# Patient Record
Sex: Male | Born: 1955 | Race: White | Hispanic: No | Smoking: Current every day smoker
Health system: Southern US, Community
[De-identification: ages and names within clinical notes are randomized; demographics above are authoritative.]

## PROBLEM LIST (undated history)

## (undated) DIAGNOSIS — M1990 Unspecified osteoarthritis, unspecified site: Secondary | ICD-10-CM

## (undated) DIAGNOSIS — F419 Anxiety disorder, unspecified: Secondary | ICD-10-CM

## (undated) DIAGNOSIS — J189 Pneumonia, unspecified organism: Secondary | ICD-10-CM

## (undated) DIAGNOSIS — F329 Major depressive disorder, single episode, unspecified: Secondary | ICD-10-CM

## (undated) DIAGNOSIS — F32A Depression, unspecified: Secondary | ICD-10-CM

## (undated) HISTORY — PX: KNEE SURGERY: SHX244

## (undated) HISTORY — PX: BACK SURGERY: SHX140

## (undated) HISTORY — PX: HERNIA REPAIR: SHX51

---

## 2001-10-03 ENCOUNTER — Emergency Department (HOSPITAL_COMMUNITY): Admission: EM | Admit: 2001-10-03 | Discharge: 2001-10-03 | Payer: Self-pay

## 2002-12-05 ENCOUNTER — Emergency Department (HOSPITAL_COMMUNITY): Admission: EM | Admit: 2002-12-05 | Discharge: 2002-12-05 | Payer: Self-pay | Admitting: Emergency Medicine

## 2002-12-05 ENCOUNTER — Encounter: Payer: Self-pay | Admitting: Emergency Medicine

## 2004-10-05 ENCOUNTER — Emergency Department (HOSPITAL_COMMUNITY): Admission: EM | Admit: 2004-10-05 | Discharge: 2004-10-06 | Payer: Self-pay | Admitting: Emergency Medicine

## 2006-12-28 ENCOUNTER — Ambulatory Visit: Payer: Self-pay | Admitting: Family Medicine

## 2006-12-28 ENCOUNTER — Encounter (INDEPENDENT_AMBULATORY_CARE_PROVIDER_SITE_OTHER): Payer: Self-pay | Admitting: Internal Medicine

## 2006-12-28 LAB — CONVERTED CEMR LAB: Herpes Simplex Vrs I&II-IgM Ab (EIA): NOT DETECTED

## 2008-09-29 ENCOUNTER — Encounter: Payer: Self-pay | Admitting: Internal Medicine

## 2009-04-20 ENCOUNTER — Encounter: Payer: Self-pay | Admitting: Internal Medicine

## 2009-04-20 ENCOUNTER — Encounter: Admission: RE | Admit: 2009-04-20 | Discharge: 2009-04-20 | Payer: Self-pay | Admitting: Family Medicine

## 2009-04-23 ENCOUNTER — Encounter: Payer: Self-pay | Admitting: Internal Medicine

## 2009-04-23 ENCOUNTER — Encounter: Admission: RE | Admit: 2009-04-23 | Discharge: 2009-04-23 | Payer: Self-pay | Admitting: Family Medicine

## 2009-04-24 ENCOUNTER — Telehealth (INDEPENDENT_AMBULATORY_CARE_PROVIDER_SITE_OTHER): Payer: Self-pay | Admitting: *Deleted

## 2009-04-24 ENCOUNTER — Ambulatory Visit: Payer: Self-pay | Admitting: Internal Medicine

## 2009-04-24 DIAGNOSIS — F17200 Nicotine dependence, unspecified, uncomplicated: Secondary | ICD-10-CM

## 2009-04-28 ENCOUNTER — Telehealth: Payer: Self-pay | Admitting: Internal Medicine

## 2009-04-29 ENCOUNTER — Telehealth (INDEPENDENT_AMBULATORY_CARE_PROVIDER_SITE_OTHER): Payer: Self-pay | Admitting: *Deleted

## 2009-04-30 ENCOUNTER — Telehealth: Payer: Self-pay | Admitting: Internal Medicine

## 2009-04-30 ENCOUNTER — Inpatient Hospital Stay (HOSPITAL_COMMUNITY): Admission: EM | Admit: 2009-04-30 | Discharge: 2009-05-16 | Payer: Self-pay | Admitting: Emergency Medicine

## 2009-05-01 ENCOUNTER — Ambulatory Visit: Payer: Self-pay | Admitting: Internal Medicine

## 2009-05-03 ENCOUNTER — Encounter (INDEPENDENT_AMBULATORY_CARE_PROVIDER_SITE_OTHER): Payer: Self-pay | Admitting: Interventional Radiology

## 2009-05-04 ENCOUNTER — Encounter: Payer: Self-pay | Admitting: Internal Medicine

## 2009-05-04 ENCOUNTER — Ambulatory Visit: Payer: Self-pay | Admitting: Oncology

## 2009-05-06 ENCOUNTER — Ambulatory Visit: Payer: Self-pay | Admitting: Thoracic Surgery

## 2009-05-07 HISTORY — PX: LUNG REMOVAL, PARTIAL: SHX233

## 2009-05-11 ENCOUNTER — Encounter: Payer: Self-pay | Admitting: Thoracic Surgery

## 2009-05-15 ENCOUNTER — Encounter: Payer: Self-pay | Admitting: Thoracic Surgery

## 2009-05-20 ENCOUNTER — Encounter: Admission: RE | Admit: 2009-05-20 | Discharge: 2009-05-20 | Payer: Self-pay | Admitting: Thoracic Surgery

## 2009-05-20 ENCOUNTER — Ambulatory Visit: Payer: Self-pay | Admitting: Thoracic Surgery

## 2009-06-03 ENCOUNTER — Encounter: Admission: RE | Admit: 2009-06-03 | Discharge: 2009-06-03 | Payer: Self-pay | Admitting: Thoracic Surgery

## 2009-06-03 ENCOUNTER — Ambulatory Visit: Payer: Self-pay | Admitting: Thoracic Surgery

## 2009-07-01 ENCOUNTER — Ambulatory Visit: Payer: Self-pay | Admitting: Thoracic Surgery

## 2009-07-01 ENCOUNTER — Encounter: Admission: RE | Admit: 2009-07-01 | Discharge: 2009-07-01 | Payer: Self-pay | Admitting: Thoracic Surgery

## 2009-07-24 ENCOUNTER — Encounter: Admission: RE | Admit: 2009-07-24 | Discharge: 2009-07-28 | Payer: Self-pay | Admitting: Anesthesiology

## 2009-07-28 ENCOUNTER — Ambulatory Visit: Payer: Self-pay | Admitting: Anesthesiology

## 2009-08-12 ENCOUNTER — Encounter: Admission: RE | Admit: 2009-08-12 | Discharge: 2009-08-12 | Payer: Self-pay | Admitting: Thoracic Surgery

## 2009-08-12 ENCOUNTER — Ambulatory Visit: Payer: Self-pay | Admitting: Thoracic Surgery

## 2009-09-01 ENCOUNTER — Encounter: Admission: RE | Admit: 2009-09-01 | Discharge: 2009-09-01 | Payer: Self-pay | Admitting: Family Medicine

## 2009-09-24 ENCOUNTER — Encounter: Admission: RE | Admit: 2009-09-24 | Discharge: 2009-09-24 | Payer: Self-pay | Admitting: Family Medicine

## 2009-09-25 ENCOUNTER — Encounter: Admission: RE | Admit: 2009-09-25 | Discharge: 2009-09-25 | Payer: Self-pay | Admitting: Anesthesiology

## 2009-10-14 ENCOUNTER — Ambulatory Visit: Payer: Self-pay | Admitting: Thoracic Surgery

## 2009-10-14 ENCOUNTER — Encounter: Admission: RE | Admit: 2009-10-14 | Discharge: 2009-10-14 | Payer: Self-pay | Admitting: Thoracic Surgery

## 2009-11-30 ENCOUNTER — Ambulatory Visit (HOSPITAL_COMMUNITY): Admission: RE | Admit: 2009-11-30 | Discharge: 2009-11-30 | Payer: Self-pay | Admitting: Orthopedic Surgery

## 2009-12-16 ENCOUNTER — Encounter: Admission: RE | Admit: 2009-12-16 | Discharge: 2009-12-16 | Payer: Self-pay | Admitting: Orthopedic Surgery

## 2010-01-01 ENCOUNTER — Ambulatory Visit: Payer: Self-pay | Admitting: Thoracic Surgery

## 2010-01-01 ENCOUNTER — Encounter: Admission: RE | Admit: 2010-01-01 | Discharge: 2010-01-01 | Payer: Self-pay | Admitting: Thoracic Surgery

## 2010-01-06 ENCOUNTER — Emergency Department (HOSPITAL_COMMUNITY): Admission: EM | Admit: 2010-01-06 | Discharge: 2010-01-06 | Payer: Self-pay | Admitting: Emergency Medicine

## 2010-01-11 ENCOUNTER — Emergency Department (HOSPITAL_COMMUNITY): Admission: EM | Admit: 2010-01-11 | Discharge: 2010-01-11 | Payer: Self-pay | Admitting: Emergency Medicine

## 2010-03-23 ENCOUNTER — Other Ambulatory Visit: Admission: RE | Admit: 2010-03-23 | Discharge: 2010-03-23 | Payer: Self-pay | Admitting: Interventional Radiology

## 2010-03-23 ENCOUNTER — Encounter: Admission: RE | Admit: 2010-03-23 | Discharge: 2010-03-23 | Payer: Self-pay | Admitting: Orthopedic Surgery

## 2010-04-18 ENCOUNTER — Emergency Department (HOSPITAL_COMMUNITY): Admission: EM | Admit: 2010-04-18 | Discharge: 2010-04-18 | Payer: Self-pay | Admitting: Emergency Medicine

## 2010-05-19 ENCOUNTER — Ambulatory Visit (HOSPITAL_COMMUNITY): Admission: RE | Admit: 2010-05-19 | Discharge: 2010-05-19 | Payer: Self-pay | Admitting: Orthopedic Surgery

## 2010-06-14 ENCOUNTER — Emergency Department (HOSPITAL_COMMUNITY): Admission: EM | Admit: 2010-06-14 | Discharge: 2010-06-14 | Payer: Self-pay | Admitting: Emergency Medicine

## 2010-09-15 ENCOUNTER — Emergency Department (HOSPITAL_COMMUNITY): Admission: EM | Admit: 2010-09-15 | Discharge: 2010-09-16 | Payer: Self-pay | Admitting: Emergency Medicine

## 2010-09-24 IMAGING — CR DG CHEST 2V
2 series · 2 of 2 positions shown · non-contrast
Comparison: None

CLINICAL DATA: Left-sided chest pain.  Cough and fever.

CHEST - 2 VIEW

[view not recorded (1 of 2)]
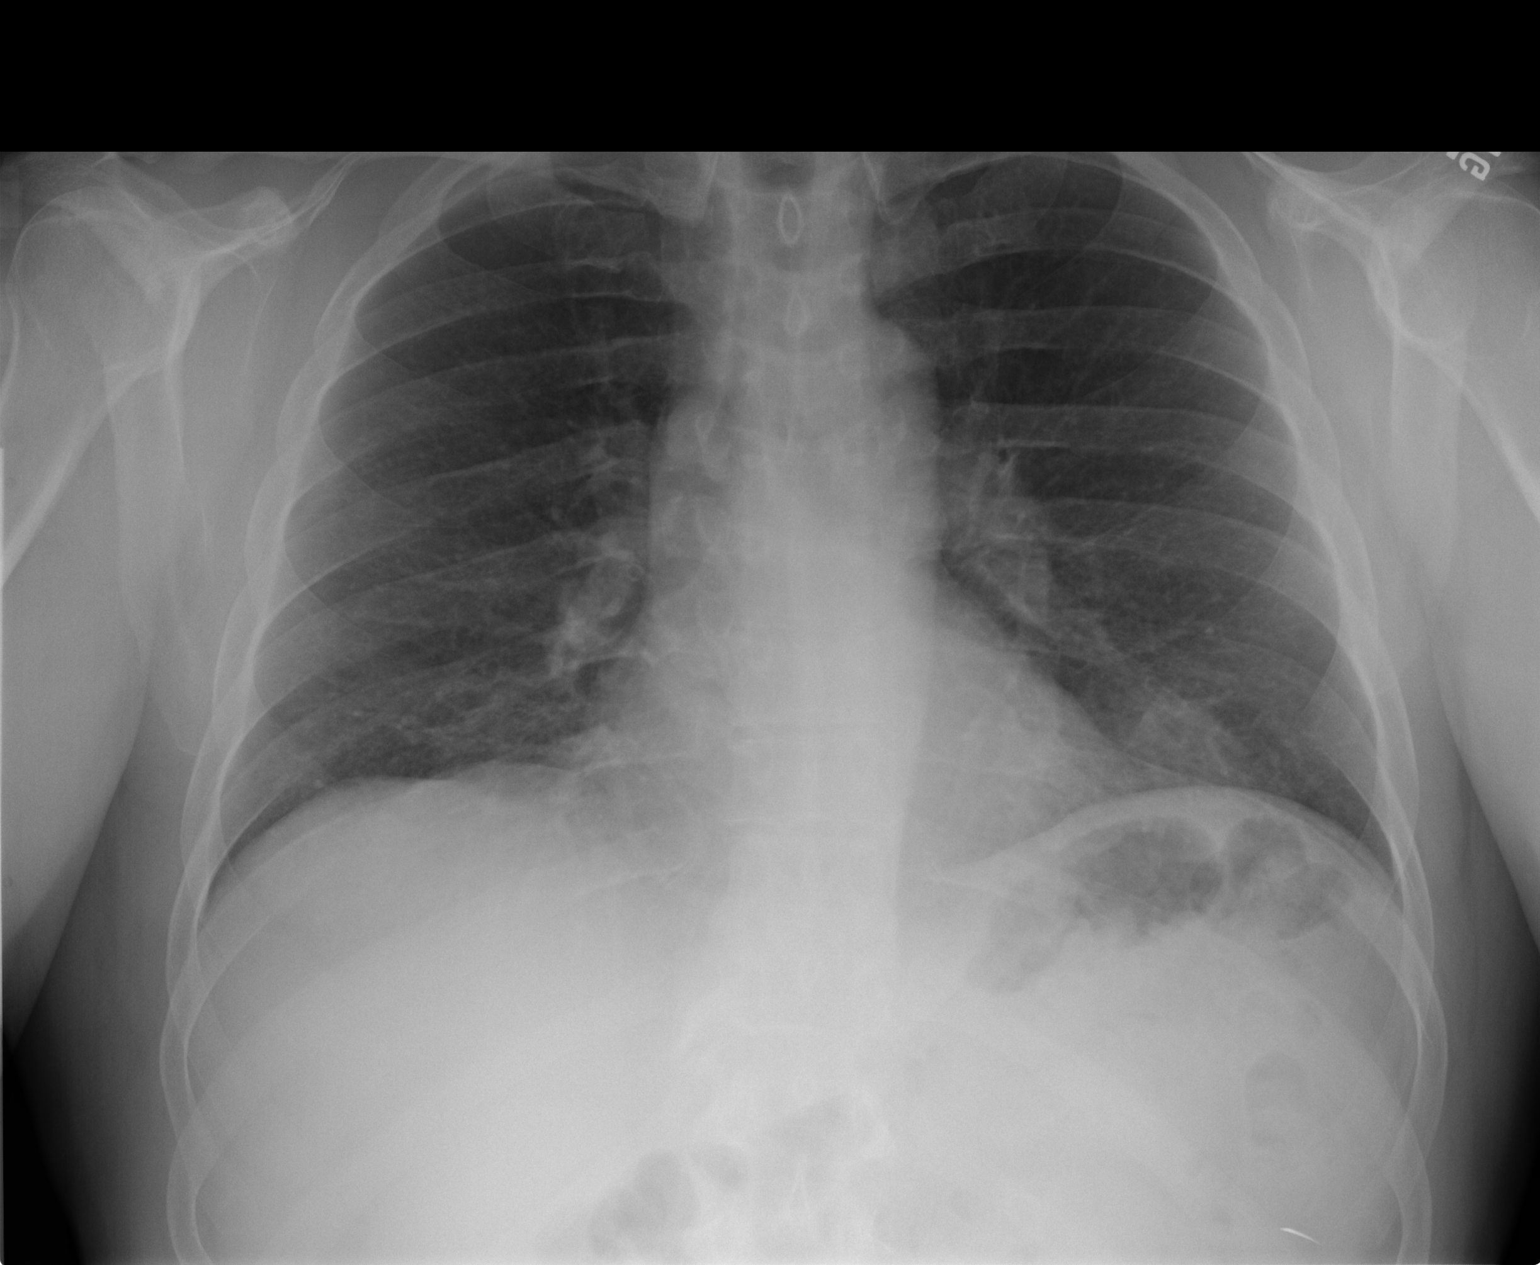

[view not recorded (2 of 2)]
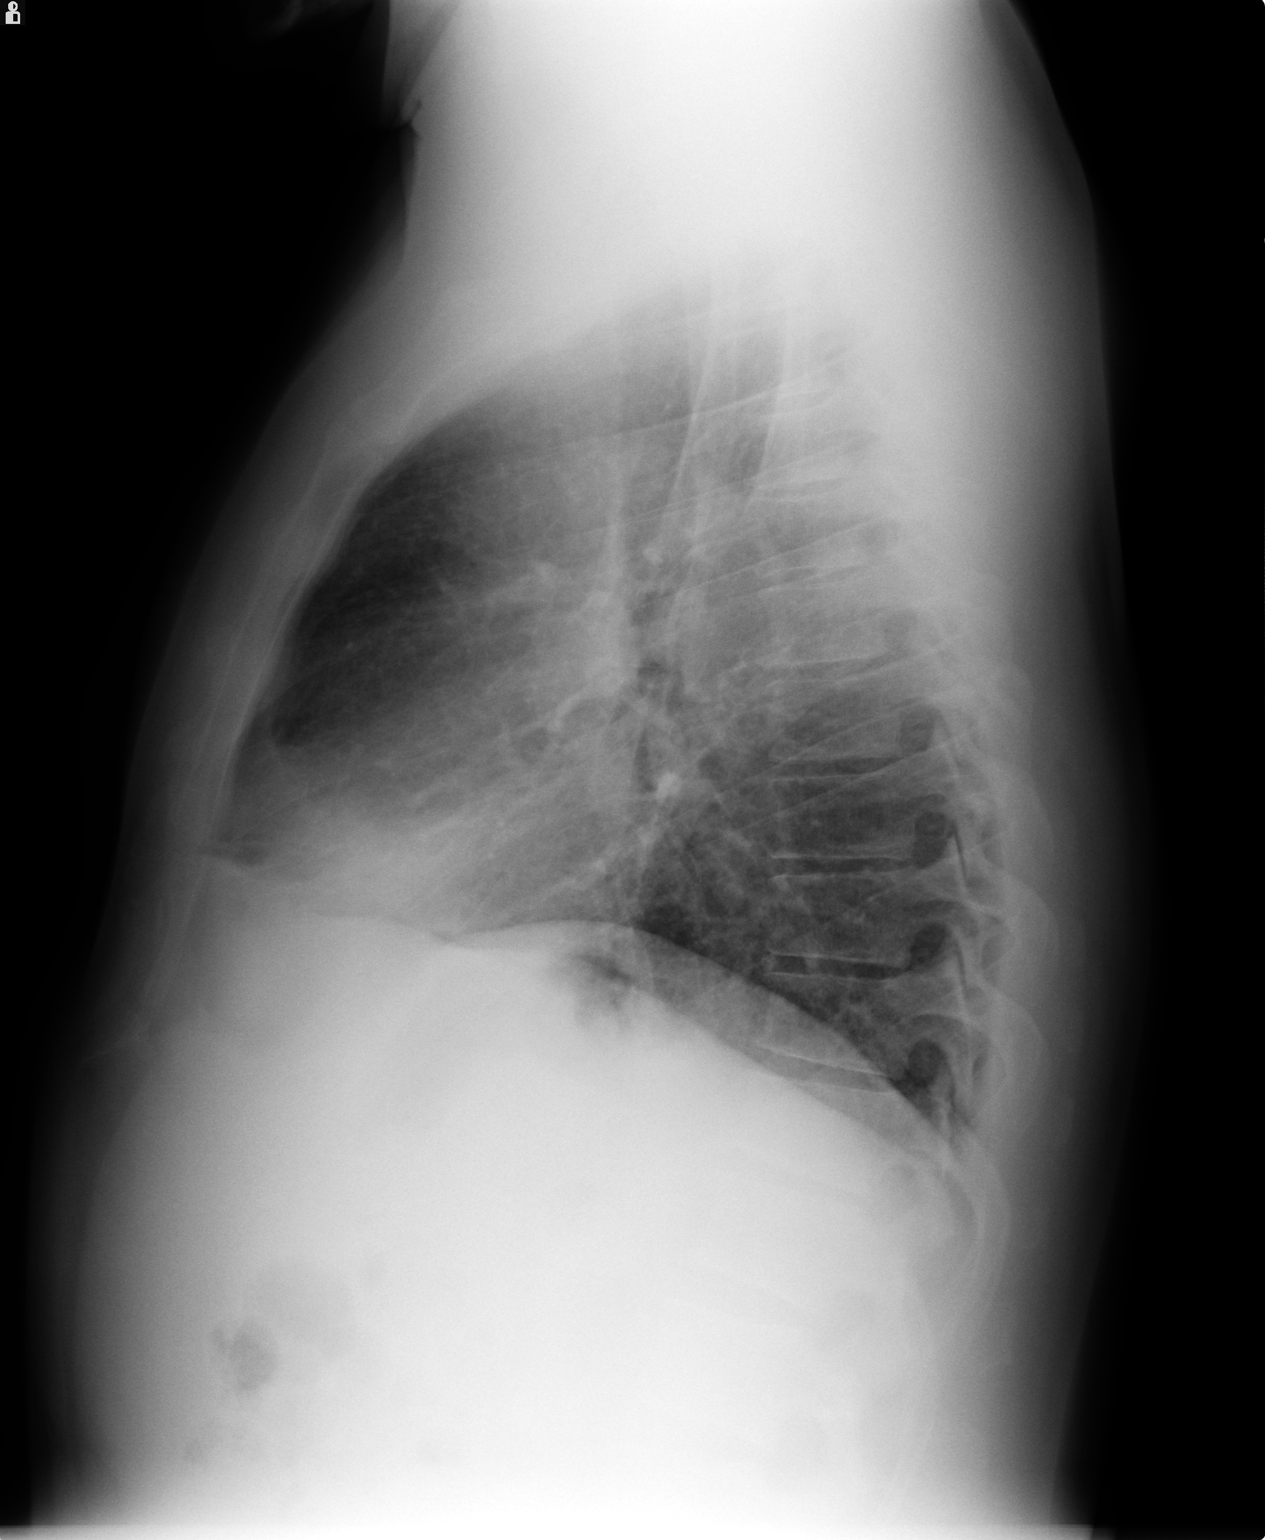

[2 of 2 positions shown; findings below may reference images not displayed]

FINDINGS: Asymmetric opacity is seen in the lingula, suspicious for
pneumonia.  Right lung is clear.

There is no evidence of pleural effusion.  Heart size is normal.
No hilar or mediastinal masses are identified.
IMPRESSION: Asymmetric lingular opacity, suspicious for pneumonia.  Post-
treatment chest radiographic followup is recommended to confirm
resolution.

## 2010-10-04 IMAGING — CT CT ANGIO CHEST
2 of 6 series · 18 of 36 positions shown · IV contrast (APPLIED)
Comparison: CT chest 1 week ago on 04/23/2009.

CLINICAL DATA: Left-sided chest pain.  Shortness of breath.
Fever.  Lung mass.  Elevated D-dimer of 1.60.  Smoker.
Leukocytosis.

CT ANGIOGRAPHY CHEST WITH CONTRAST 04/30/2009:
TECHNIQUE: Multidetector CT imaging of the chest was performed
using the standard protocol during bolus administration of
intravenous contrast. Multiplanar CT image reconstructions
including MIPs were obtained to evaluate the vascular anatomy.
Contrast: 80 ml Zmnipaque-JOO IV.

[Series 6: pe thins @ 1mm · axial · 0.65mm/px · z∈[-304,-110]mm · 17 of 216 slices shown]
[im 11/216  lung]
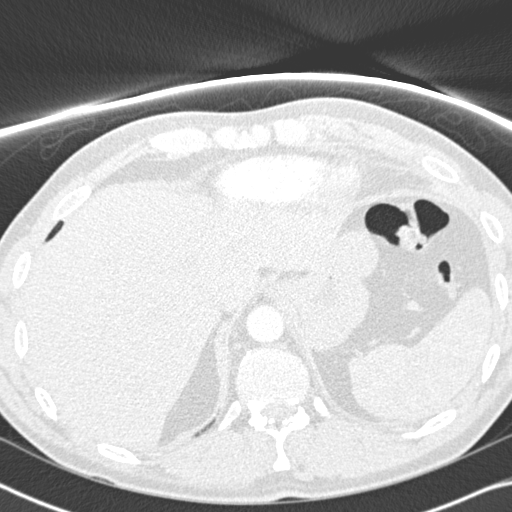
[im 22/216  mediastinal]
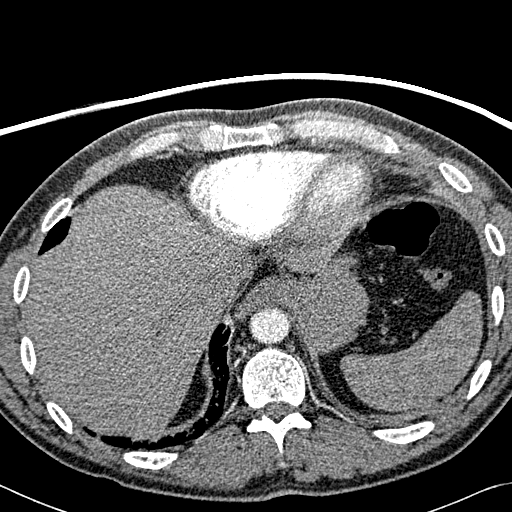
[im 33/216  lung]
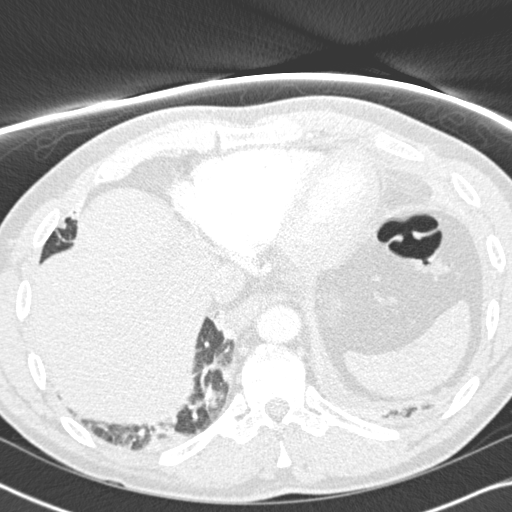
[im 44/216  mediastinal]
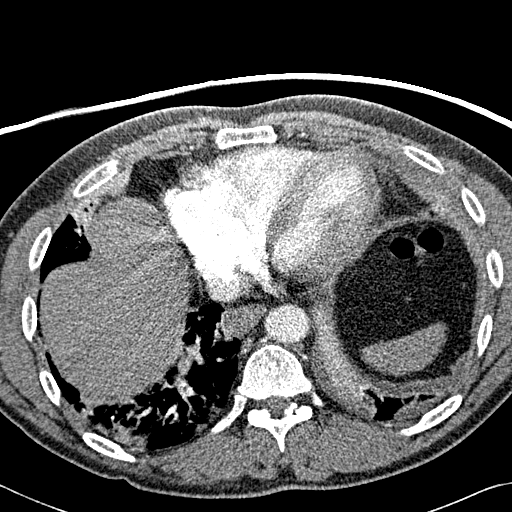
[im 65/216  lung]
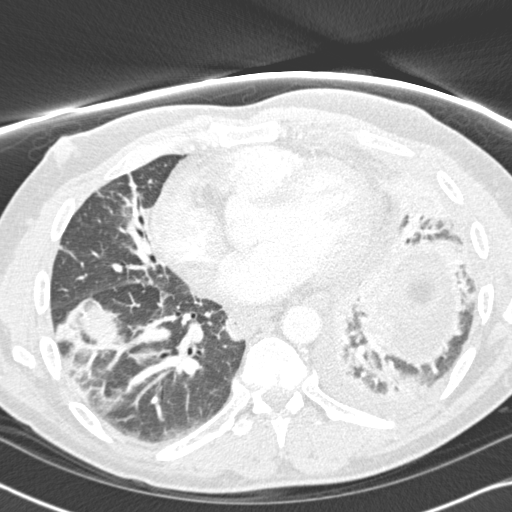
[im 76/216  mediastinal]
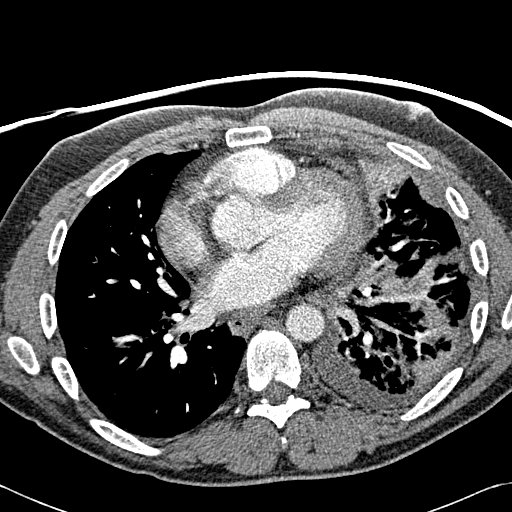
[im 87/216  lung]
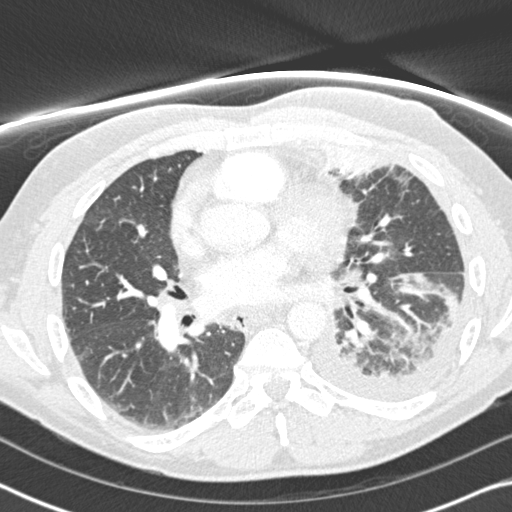
[im 97/216  mediastinal]
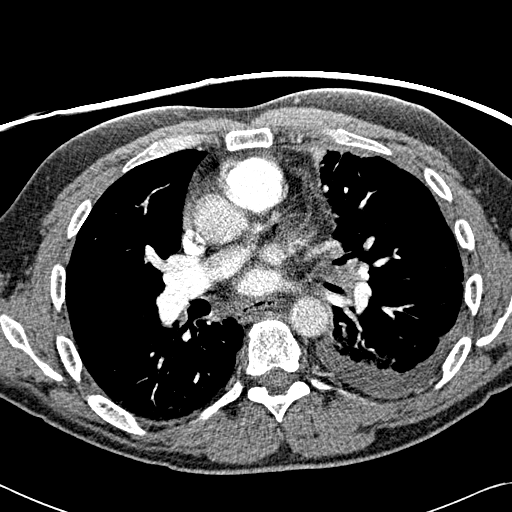
[im 108/216  lung]
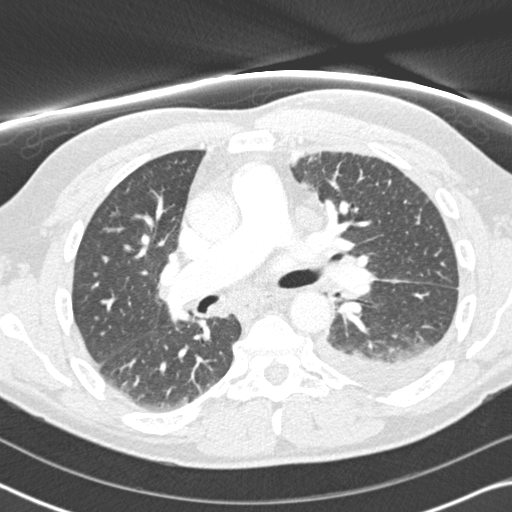
[im 119/216  mediastinal]
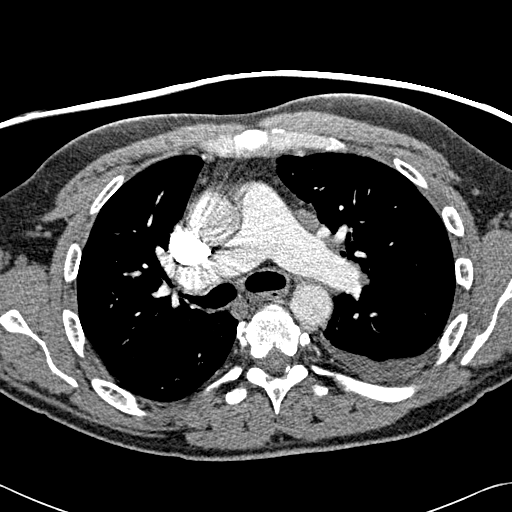
[im 130/216  lung]
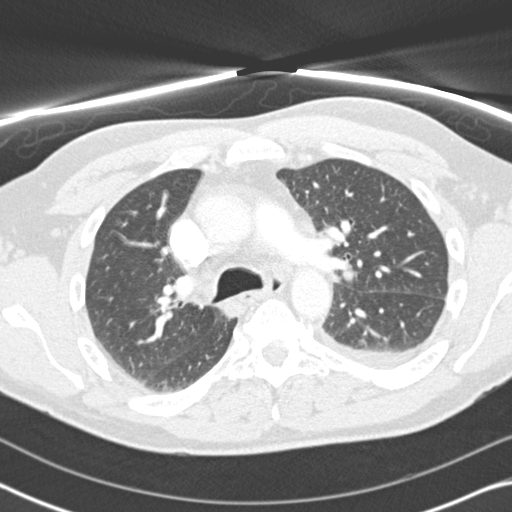
[im 140/216  mediastinal]
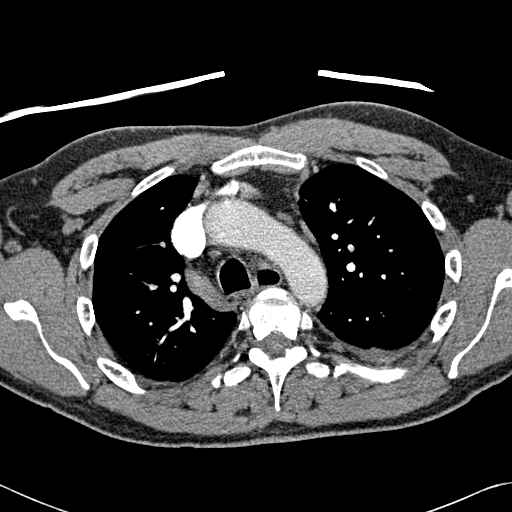
[im 151/216  lung]
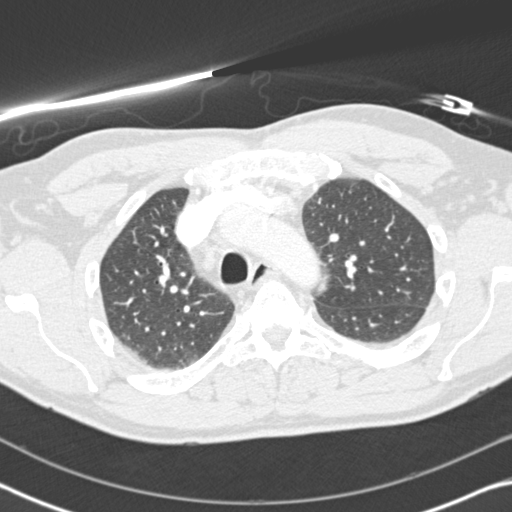
[im 173/216  mediastinal]
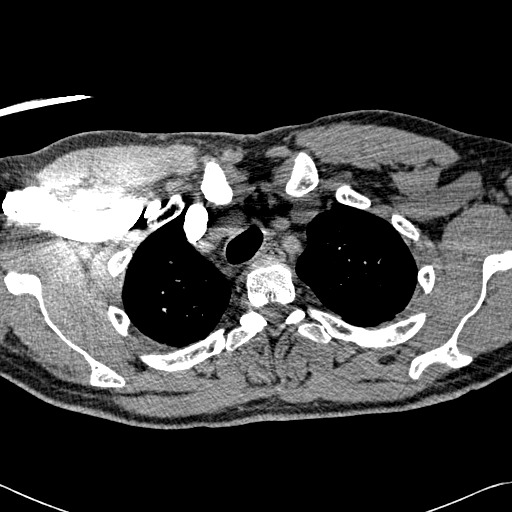
[im 183/216  lung]
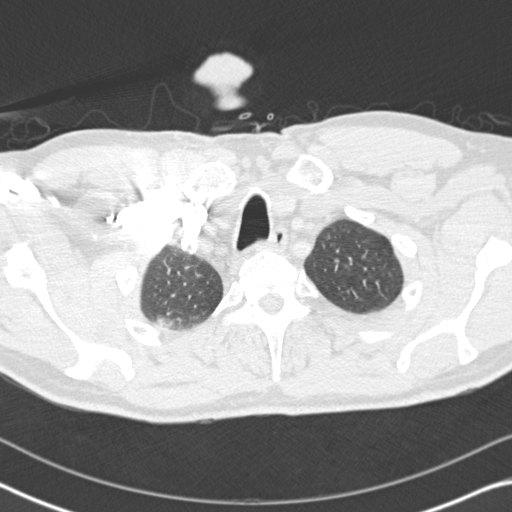
[im 194/216  mediastinal]
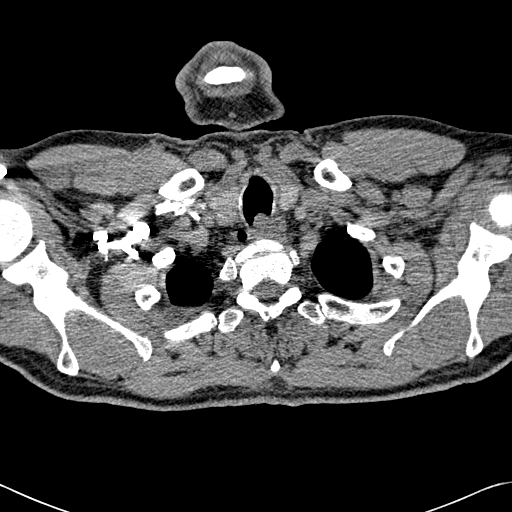
[im 205/216  lung]
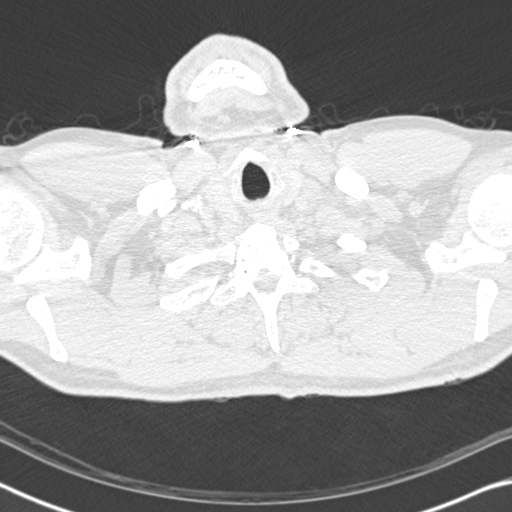

[Series 602: <mpr thick range> · coronal · 0.65mm/px · 1 of 106 slices shown]
[im 53/106  mediastinal]
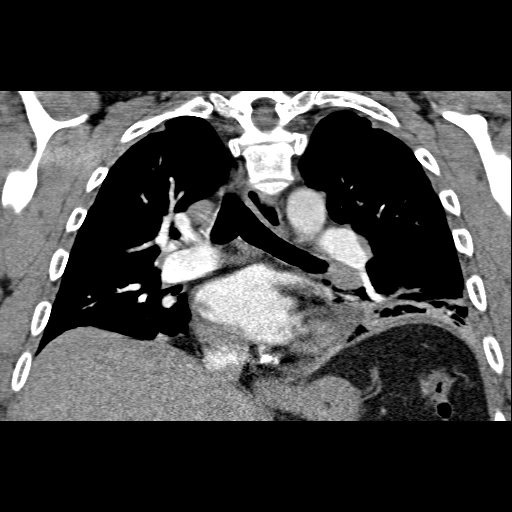

[18 of 36 positions shown; findings below may reference images not displayed]

FINDINGS: Contrast opacification of the pulmonary arteries is very
good, yielding a very good diagnostic quality study.  No filling
defects within either main pulmonary artery or their branches in
either lung to suggest pulmonary embolism.  Heart enlarged with
left ventricular predominance.  No pericardial effusion.  No
visible coronary artery calcification.  Minimal aortic arch
atherosclerosis; thoracic and upper abdominal aorta otherwise
unremarkable.

Soft tissue mass anteriorly in the lingula.  Interval development
of confluent airspace opacities in the left lower lobe and
minimally in the right middle and lower lobes.  Interval
development of a left pleural effusion.  Interval increase in size
of a left hilar lymph node, now measuring approximately 1.7 x
cm (previously 1.2 x 1.9 cm).  Interval increase in size of a AP
window (station 5) mediastinal lymph node currently measuring
approximately 1.4 x 2.2 cm (previously 1.0 x 1.6 cm). Scattered
normal-sized lymph nodes elsewhere in the mediastinum.  Visualized
thyroid gland unremarkable.

Small cysts in the visualized portion of the liver.  No significant
abnormalities within the visualized abdomen.  Bone window images
demonstrate mild thoracic spondylosis but no evidence of osseous
metastatic disease.

Review of the MIP images confirms the above findings.
IMPRESSION: 1.  No evidence of pulmonary embolism.
2.  Interval development of pneumonia in the left lower lobe and to
a lesser degree right middle and lower lobes since the CT 1 week
ago.
3.  Anterior lingular soft tissue mass again demonstrated.
4.  Interval increase in size of left hilar and station 5
mediastinal lymph nodes, measured above.  Their increase in size
over a 1-week interval suggest that there may be a reactive
component to these nodes, related to the pneumonia.

I note that the patient is scheduled for a PET CT later today.  I
would recommend postponing the PET CT until the acute pneumonia has
resolved.

## 2010-10-06 IMAGING — CR DG CHEST 2V
2 series · 2 of 2 positions shown · non-contrast
Comparison: Chest CT 04/30/2009

CLINICAL DATA: Fever, left-sided chest pain, pneumonia

CHEST - 2 VIEW

[w chest pa]
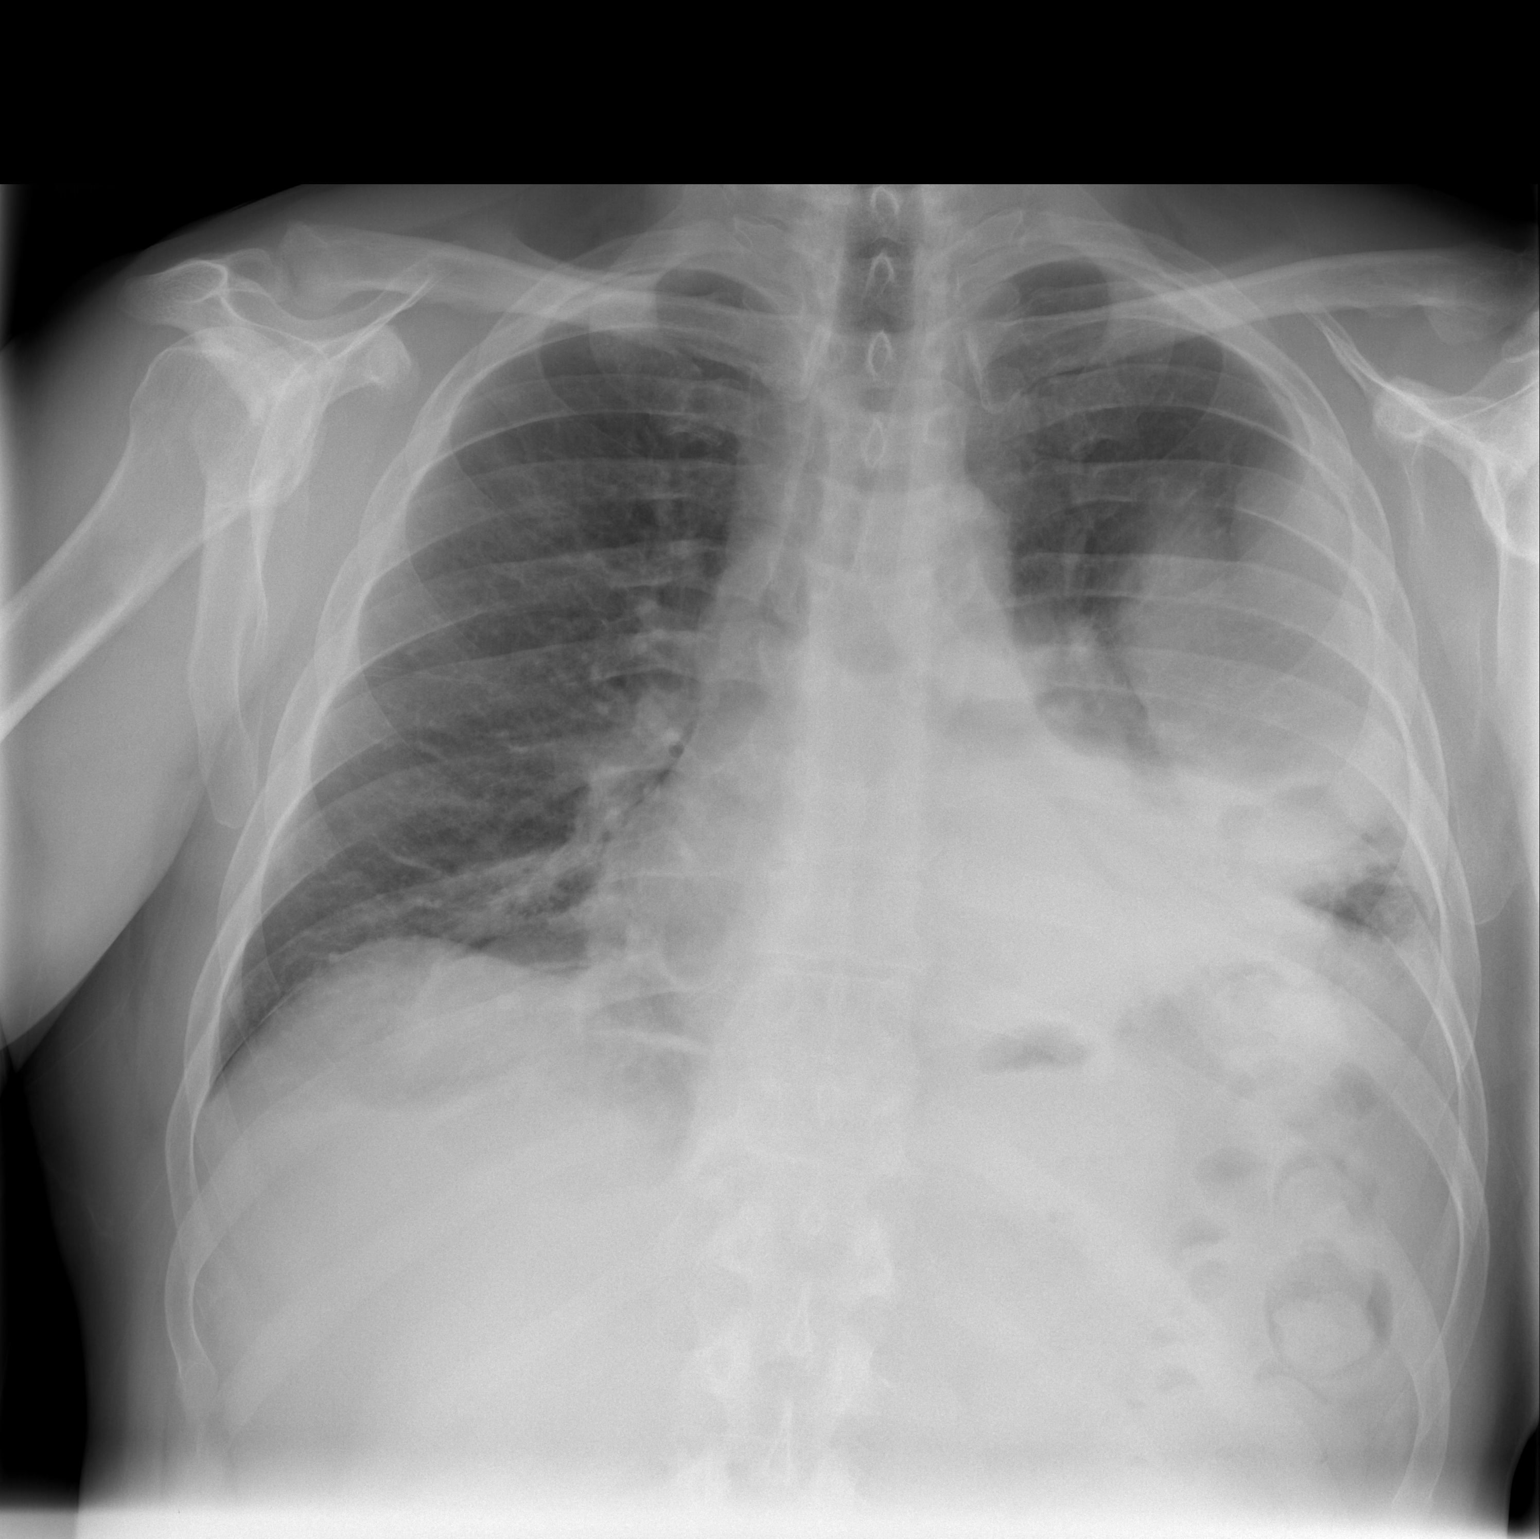

[w chest lat]
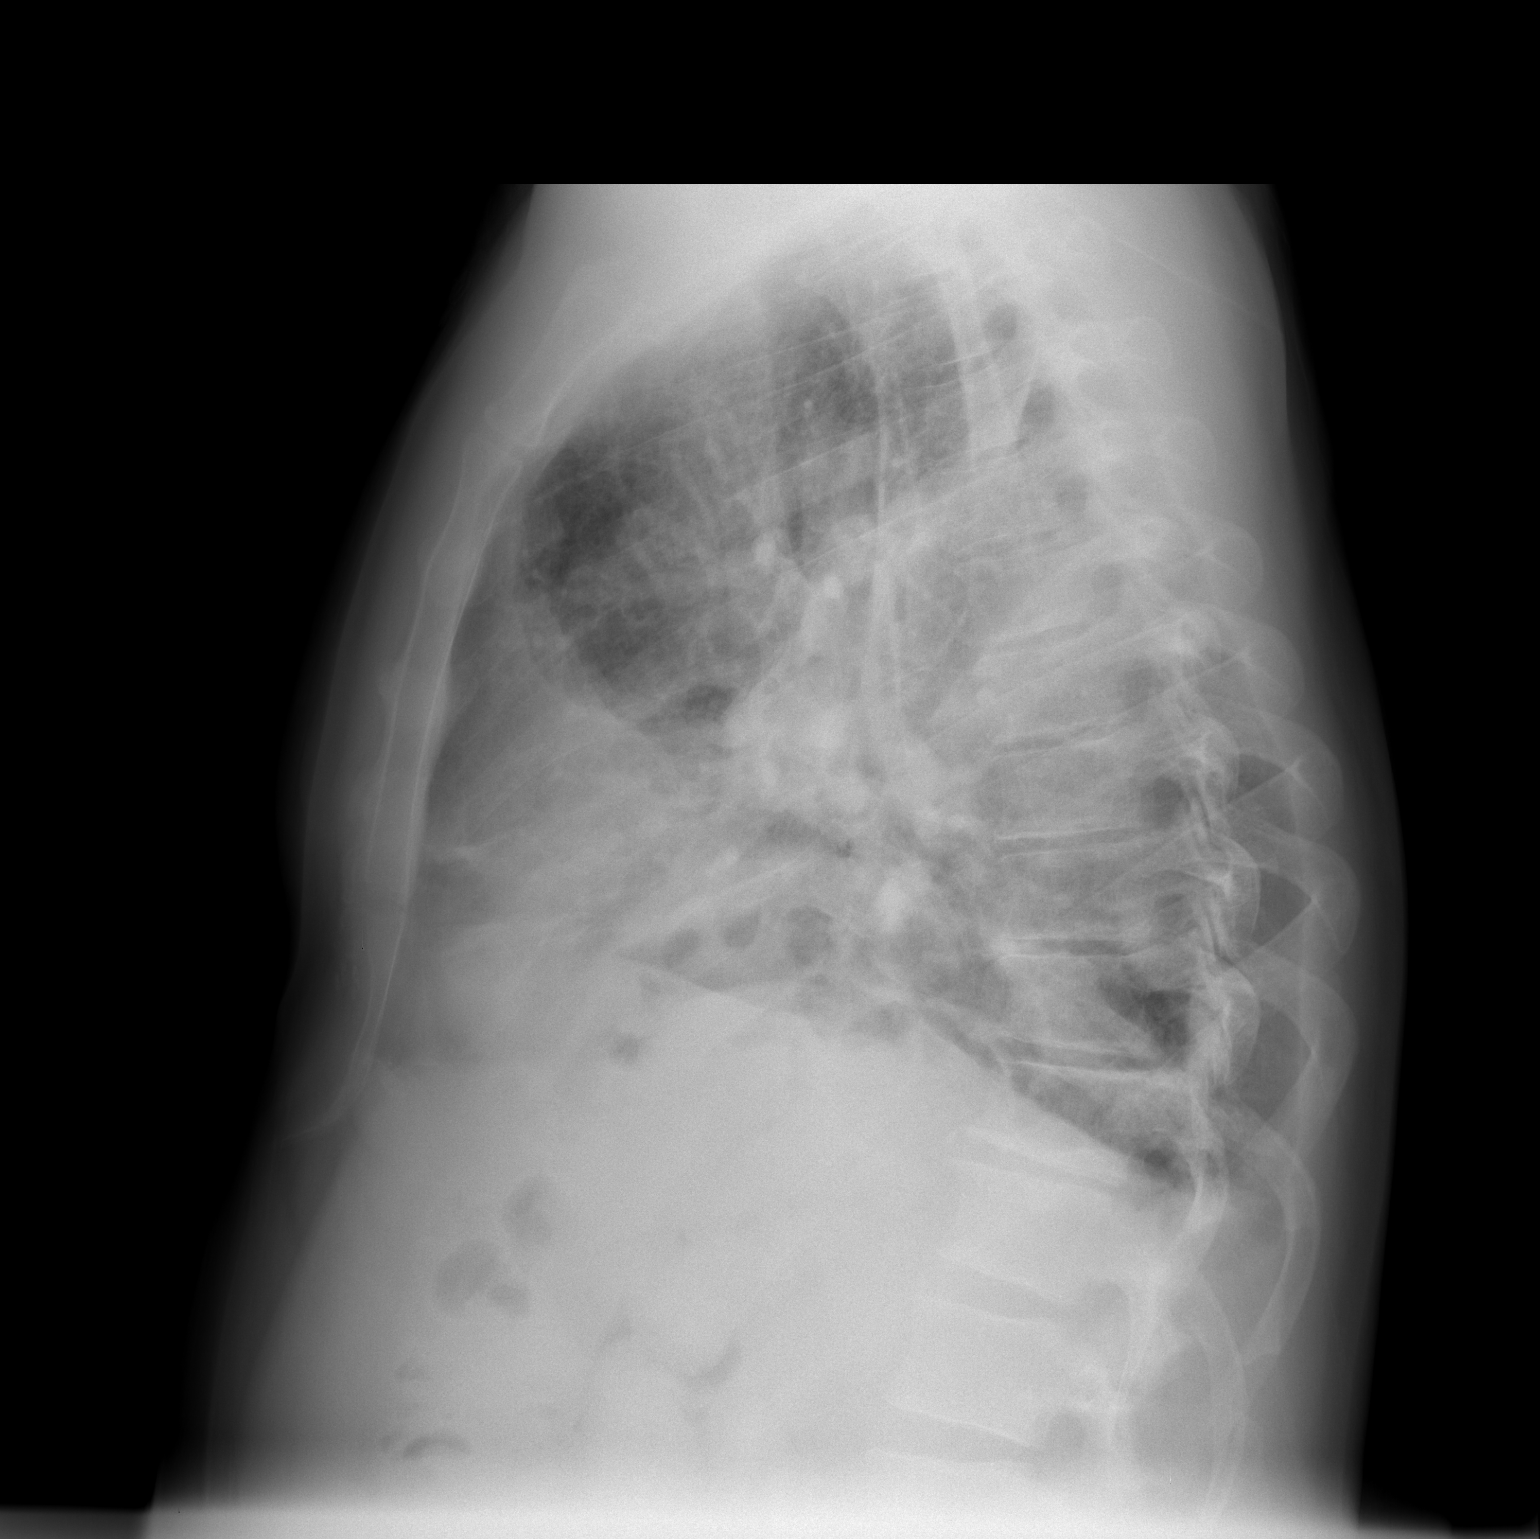

[2 of 2 positions shown; findings below may reference images not displayed]

FINDINGS: Increased partly loculated left pleural effusion is
noted.  Lungs are hypo aerated with bibasilar atelectasis and left
greater than right lower lobe airspace opacities.
IMPRESSION: Increased loculated left pleural effusion with persistent
hypoaeration and bibasilar opacities.

## 2010-11-29 ENCOUNTER — Encounter: Payer: Self-pay | Admitting: Family Medicine

## 2010-12-04 ENCOUNTER — Emergency Department (HOSPITAL_COMMUNITY)
Admission: EM | Admit: 2010-12-04 | Discharge: 2010-12-04 | Payer: Self-pay | Source: Home / Self Care | Admitting: Emergency Medicine

## 2010-12-04 LAB — BASIC METABOLIC PANEL
Calcium: 9.5 mg/dL (ref 8.4–10.5)
GFR calc Af Amer: >60 mL/min (ref >60)
GFR calc non Af Amer: >60 mL/min (ref >60)
Glucose, Bld: 101 mg/dL — ABNORMAL HIGH (ref 70–99)
Potassium: 4.4 mEq/L (ref 3.5–5.1)
Sodium: 140 mEq/L (ref 135–145)

## 2010-12-04 LAB — DIFFERENTIAL
Basophils Absolute: 0 10*3/uL (ref 0.0–0.1)
Basophils Relative: 0 % (ref 0–1)
Eosinophils Relative: 1 % (ref 0–5)
Lymphocytes Relative: 13 % (ref 12–46)
Neutro Abs: 8.6 10*3/uL — ABNORMAL HIGH (ref 1.7–7.7)

## 2010-12-04 LAB — CBC
HCT: 39.1 % (ref 39.0–52.0)
MCHC: 35.3 g/dL (ref 30.0–36.0)
RDW: 13.2 % (ref 11.5–15.5)
WBC: 10.8 10*3/uL — ABNORMAL HIGH (ref 4.0–10.5)

## 2010-12-15 ENCOUNTER — Other Ambulatory Visit: Payer: Self-pay | Admitting: Rheumatology

## 2010-12-15 DIAGNOSIS — M064 Inflammatory polyarthropathy: Secondary | ICD-10-CM

## 2010-12-20 ENCOUNTER — Other Ambulatory Visit: Payer: Self-pay

## 2010-12-29 ENCOUNTER — Other Ambulatory Visit: Payer: Self-pay

## 2011-01-18 ENCOUNTER — Other Ambulatory Visit: Payer: Self-pay

## 2011-01-23 LAB — COMPREHENSIVE METABOLIC PANEL
ALT: 16 U/L (ref 0–53)
AST: 30 U/L (ref 0–37)
Alkaline Phosphatase: 67 U/L (ref 39–117)
CO2: 30 mEq/L (ref 19–32)
Calcium: 8.9 mg/dL (ref 8.4–10.5)
Chloride: 106 mEq/L (ref 96–112)
GFR calc Af Amer: >60 mL/min (ref >60)
GFR calc non Af Amer: >60 mL/min (ref >60)
Glucose, Bld: 111 mg/dL — ABNORMAL HIGH (ref 70–99)
Potassium: 4.5 mEq/L (ref 3.5–5.1)
Sodium: 142 mEq/L (ref 135–145)

## 2011-01-23 LAB — PROTIME-INR
INR: 1.01 (ref 0.00–1.49)
Prothrombin Time: 13.2 seconds (ref 11.6–15.2)

## 2011-01-23 LAB — URINALYSIS, ROUTINE W REFLEX MICROSCOPIC
Bilirubin Urine: NEGATIVE
Glucose, UA: NEGATIVE mg/dL
Hgb urine dipstick: NEGATIVE
Protein, ur: NEGATIVE mg/dL
Specific Gravity, Urine: 1.035 — ABNORMAL HIGH (ref 1.005–1.030)

## 2011-01-23 LAB — CBC
HCT: 39.3 % (ref 39.0–52.0)
Hemoglobin: 13.8 g/dL (ref 13.0–17.0)
RBC: 4.37 MIL/uL (ref 4.22–5.81)
WBC: 9.2 10*3/uL (ref 4.0–10.5)

## 2011-01-23 LAB — DIFFERENTIAL
Basophils Relative: 1 % (ref 0–1)
Eosinophils Absolute: 0.4 10*3/uL (ref 0.0–0.7)
Eosinophils Relative: 4 % (ref 0–5)
Lymphs Abs: 2.8 10*3/uL (ref 0.7–4.0)
Neutrophils Relative %: 59 % (ref 43–77)

## 2011-01-23 LAB — SURGICAL PCR SCREEN
MRSA, PCR: NEGATIVE
Staphylococcus aureus: POSITIVE — AB

## 2011-01-24 LAB — POCT CARDIAC MARKERS
CKMB, poc: 4.7 ng/mL (ref 1.0–8.0)
CKMB, poc: 9.1 ng/mL (ref 1.0–8.0)
Myoglobin, poc: 227 ng/mL (ref 12–200)
Myoglobin, poc: 228 ng/mL (ref 12–200)
Troponin i, poc: <0.05 ng/mL (ref 0.00–0.09)
Troponin i, poc: <0.05 ng/mL (ref 0.00–0.09)

## 2011-01-24 LAB — DIFFERENTIAL
Basophils Absolute: 0 10*3/uL (ref 0.0–0.1)
Basophils Relative: 0 % (ref 0–1)
Eosinophils Absolute: 0.2 K/uL (ref 0.0–0.7)
Eosinophils Relative: 2 % (ref 0–5)
Lymphocytes Relative: 25 % (ref 12–46)
Lymphs Abs: 2.1 10*3/uL (ref 0.7–4.0)
Monocytes Absolute: 0.7 10*3/uL (ref 0.1–1.0)
Monocytes Relative: 9 % (ref 3–12)
Neutro Abs: 5.4 10*3/uL (ref 1.7–7.7)
Neutrophils Relative %: 64 % (ref 43–77)

## 2011-01-24 LAB — D-DIMER, QUANTITATIVE: D-Dimer, Quant: 1.8 ug{FEU}/mL — ABNORMAL HIGH (ref 0.00–0.48)

## 2011-01-24 LAB — BASIC METABOLIC PANEL WITH GFR
BUN: 16 mg/dL (ref 6–23)
CO2: 33 meq/L — ABNORMAL HIGH (ref 19–32)
Chloride: 100 meq/L (ref 96–112)
Creatinine, Ser: 1.08 mg/dL (ref 0.4–1.5)
Glucose, Bld: 109 mg/dL — ABNORMAL HIGH (ref 70–99)

## 2011-01-24 LAB — ACETAMINOPHEN LEVEL: Acetaminophen (Tylenol), Serum: <10 ug/mL — ABNORMAL LOW (ref 10–30)

## 2011-01-24 LAB — CBC
HCT: 39.3 % (ref 39.0–52.0)
Hemoglobin: 13.1 g/dL (ref 13.0–17.0)
MCHC: 33.5 g/dL (ref 30.0–36.0)
MCV: 90.5 fL (ref 78.0–100.0)
Platelets: 177 K/uL (ref 150–400)
RBC: 4.34 MIL/uL (ref 4.22–5.81)
RDW: 14.9 % (ref 11.5–15.5)
WBC: 8.4 K/uL (ref 4.0–10.5)

## 2011-01-24 LAB — BASIC METABOLIC PANEL
Calcium: 9 mg/dL (ref 8.4–10.5)
GFR calc Af Amer: >60 mL/min (ref >60)
GFR calc non Af Amer: >60 mL/min (ref >60)
Potassium: 3.9 mEq/L (ref 3.5–5.1)
Sodium: 138 mEq/L (ref 135–145)

## 2011-01-24 LAB — APTT: aPTT: 33 seconds (ref 24–37)

## 2011-01-24 LAB — ETHANOL: Alcohol, Ethyl (B): <5 mg/dL (ref 0–10)

## 2011-01-24 LAB — PROTIME-INR
INR: 1.08 (ref 0.00–1.49)
Prothrombin Time: 13.9 s (ref 11.6–15.2)

## 2011-01-31 LAB — CBC
Hemoglobin: 15 g/dL (ref 13.0–17.0)
MCHC: 32.3 g/dL (ref 30.0–36.0)
MCHC: 32.7 g/dL (ref 30.0–36.0)
MCV: 90.4 fL (ref 78.0–100.0)
MCV: 90.9 fL (ref 78.0–100.0)
Platelets: 208 10*3/uL (ref 150–400)
RBC: 5.03 MIL/uL (ref 4.22–5.81)
WBC: 10 10*3/uL (ref 4.0–10.5)
WBC: 9.6 10*3/uL (ref 4.0–10.5)

## 2011-01-31 LAB — COMPREHENSIVE METABOLIC PANEL
ALT: 58 U/L — ABNORMAL HIGH (ref 0–53)
AST: 46 U/L — ABNORMAL HIGH (ref 0–37)
Albumin: 3.8 g/dL (ref 3.5–5.2)
CO2: 26 mEq/L (ref 19–32)
Calcium: 8.5 mg/dL (ref 8.4–10.5)
Calcium: 9 mg/dL (ref 8.4–10.5)
Chloride: 104 mEq/L (ref 96–112)
Chloride: 106 mEq/L (ref 96–112)
Creatinine, Ser: 1.02 mg/dL (ref 0.4–1.5)
GFR calc Af Amer: >60 mL/min (ref >60)
GFR calc non Af Amer: >60 mL/min (ref >60)
Glucose, Bld: 119 mg/dL — ABNORMAL HIGH (ref 70–99)
Sodium: 139 mEq/L (ref 135–145)
Total Bilirubin: 0.5 mg/dL (ref 0.3–1.2)
Total Protein: 6.8 g/dL (ref 6.0–8.3)

## 2011-01-31 LAB — URINALYSIS, ROUTINE W REFLEX MICROSCOPIC
Hgb urine dipstick: NEGATIVE
Specific Gravity, Urine: 1.036 — ABNORMAL HIGH (ref 1.005–1.030)
pH: 6 (ref 5.0–8.0)

## 2011-01-31 LAB — DIFFERENTIAL
Basophils Absolute: 0 10*3/uL (ref 0.0–0.1)
Basophils Relative: 0 % (ref 0–1)
Eosinophils Absolute: 0.1 10*3/uL (ref 0.0–0.7)
Eosinophils Relative: 1 % (ref 0–5)
Eosinophils Relative: 1 % (ref 0–5)
Lymphocytes Relative: 10 % — ABNORMAL LOW (ref 12–46)
Lymphs Abs: 1 10*3/uL (ref 0.7–4.0)
Lymphs Abs: 1.8 10*3/uL (ref 0.7–4.0)
Monocytes Absolute: 0.7 10*3/uL (ref 0.1–1.0)
Monocytes Relative: 7 % (ref 3–12)
Neutrophils Relative %: 76 % (ref 43–77)

## 2011-01-31 LAB — LIPASE, BLOOD: Lipase: 25 U/L (ref 11–59)

## 2011-02-02 ENCOUNTER — Ambulatory Visit
Admission: RE | Admit: 2011-02-02 | Discharge: 2011-02-02 | Disposition: A | Discharge status: Home / Self Care | Payer: Self-pay | Source: Ambulatory Visit | Attending: Rheumatology | Admitting: Rheumatology

## 2011-02-02 DIAGNOSIS — M064 Inflammatory polyarthropathy: Secondary | ICD-10-CM

## 2011-02-02 MED ORDER — GADOBENATE DIMEGLUMINE 529 MG/ML IV SOLN
20.0000 mL | Freq: Once | INTRAVENOUS | Status: AC | PRN
  Administered 2011-02-02: 20 mL via INTRAVENOUS

## 2011-02-13 LAB — COMPREHENSIVE METABOLIC PANEL
AST: 27 U/L (ref 0–37)
Albumin: 2.4 g/dL — ABNORMAL LOW (ref 3.5–5.2)
BUN: 6 mg/dL (ref 6–23)
Calcium: 8.5 mg/dL (ref 8.4–10.5)
Chloride: 101 mEq/L (ref 96–112)
Creatinine, Ser: 0.97 mg/dL (ref 0.4–1.5)
GFR calc Af Amer: >60 mL/min (ref >60)
GFR calc non Af Amer: >60 mL/min (ref >60)
Total Bilirubin: 0.5 mg/dL (ref 0.3–1.2)

## 2011-02-13 LAB — CBC
HCT: 30 % — ABNORMAL LOW (ref 39.0–52.0)
HCT: 31.1 % — ABNORMAL LOW (ref 39.0–52.0)
HCT: 33.2 % — ABNORMAL LOW (ref 39.0–52.0)
HCT: 33.4 % — ABNORMAL LOW (ref 39.0–52.0)
HCT: 34.5 % — ABNORMAL LOW (ref 39.0–52.0)
Hemoglobin: 10.4 g/dL — ABNORMAL LOW (ref 13.0–17.0)
Hemoglobin: 10.7 g/dL — ABNORMAL LOW (ref 13.0–17.0)
Hemoglobin: 11.1 g/dL — ABNORMAL LOW (ref 13.0–17.0)
Hemoglobin: 11.7 g/dL — ABNORMAL LOW (ref 13.0–17.0)
MCHC: 33.4 g/dL (ref 30.0–36.0)
MCHC: 34.2 g/dL (ref 30.0–36.0)
MCV: 87.6 fL (ref 78.0–100.0)
MCV: 88.2 fL (ref 78.0–100.0)
MCV: 88.3 fL (ref 78.0–100.0)
Platelets: 317 10*3/uL (ref 150–400)
Platelets: 472 10*3/uL — ABNORMAL HIGH (ref 150–400)
RBC: 3.49 MIL/uL — ABNORMAL LOW (ref 4.22–5.81)
RBC: 3.73 MIL/uL — ABNORMAL LOW (ref 4.22–5.81)
RBC: 3.81 MIL/uL — ABNORMAL LOW (ref 4.22–5.81)
RBC: 3.91 MIL/uL — ABNORMAL LOW (ref 4.22–5.81)
RDW: 13.3 % (ref 11.5–15.5)
RDW: 13.6 % (ref 11.5–15.5)
WBC: 12.7 10*3/uL — ABNORMAL HIGH (ref 4.0–10.5)
WBC: 6.7 10*3/uL (ref 4.0–10.5)
WBC: 7.4 10*3/uL (ref 4.0–10.5)
WBC: 8.9 10*3/uL (ref 4.0–10.5)

## 2011-02-13 LAB — CROSSMATCH: Antibody Screen: NEGATIVE

## 2011-02-13 LAB — BASIC METABOLIC PANEL
BUN: 12 mg/dL (ref 6–23)
BUN: 9 mg/dL (ref 6–23)
CO2: 26 mEq/L (ref 19–32)
CO2: 28 mEq/L (ref 19–32)
Calcium: 8.3 mg/dL — ABNORMAL LOW (ref 8.4–10.5)
Calcium: 8.6 mg/dL (ref 8.4–10.5)
Chloride: 100 mEq/L (ref 96–112)
Chloride: 103 mEq/L (ref 96–112)
Chloride: 104 mEq/L (ref 96–112)
Chloride: 106 mEq/L (ref 96–112)
Creatinine, Ser: 0.92 mg/dL (ref 0.4–1.5)
Creatinine, Ser: 1.08 mg/dL (ref 0.4–1.5)
GFR calc Af Amer: >60 mL/min (ref >60)
GFR calc Af Amer: >60 mL/min (ref >60)
GFR calc Af Amer: >60 mL/min (ref >60)
GFR calc Af Amer: >60 mL/min (ref >60)
GFR calc non Af Amer: >60 mL/min (ref >60)
GFR calc non Af Amer: >60 mL/min (ref >60)
GFR calc non Af Amer: >60 mL/min (ref >60)
GFR calc non Af Amer: >60 mL/min (ref >60)
Glucose, Bld: 136 mg/dL — ABNORMAL HIGH (ref 70–99)
Glucose, Bld: 137 mg/dL — ABNORMAL HIGH (ref 70–99)
Potassium: 4.1 mEq/L (ref 3.5–5.1)
Potassium: 4.1 mEq/L (ref 3.5–5.1)
Potassium: 4.2 mEq/L (ref 3.5–5.1)
Potassium: 4.2 mEq/L (ref 3.5–5.1)
Potassium: 4.3 mEq/L (ref 3.5–5.1)
Sodium: 135 mEq/L (ref 135–145)
Sodium: 137 mEq/L (ref 135–145)
Sodium: 137 mEq/L (ref 135–145)
Sodium: 138 mEq/L (ref 135–145)
Sodium: 139 mEq/L (ref 135–145)

## 2011-02-13 LAB — URINE CULTURE
Colony Count: NO GROWTH
Culture: NO GROWTH

## 2011-02-13 LAB — BLOOD GAS, ARTERIAL
Acid-Base Excess: 1.2 mmol/L (ref 0.0–2.0)
Bicarbonate: 25.3 mEq/L — ABNORMAL HIGH (ref 20.0–24.0)
TCO2: 26.6 mmol/L (ref 0–100)
pCO2 arterial: 40.3 mmHg (ref 35.0–45.0)
pH, Arterial: 7.414 (ref 7.350–7.450)
pO2, Arterial: 67.3 mmHg — ABNORMAL LOW (ref 80.0–100.0)

## 2011-02-13 LAB — POCT I-STAT 4, (NA,K, GLUC, HGB,HCT): HCT: 31 % — ABNORMAL LOW (ref 39.0–52.0)

## 2011-02-13 LAB — ABO/RH
ABO/RH(D): O POS
ABO/RH(D): O POS

## 2011-02-13 LAB — BODY FLUID CULTURE

## 2011-02-13 LAB — CULTURE, ROUTINE-ABSCESS: Culture: NO GROWTH

## 2011-02-14 LAB — CBC
HCT: 34.1 % — ABNORMAL LOW (ref 39.0–52.0)
HCT: 35.8 % — ABNORMAL LOW (ref 39.0–52.0)
HCT: 38.4 % — ABNORMAL LOW (ref 39.0–52.0)
HCT: 42.9 % (ref 39.0–52.0)
Hemoglobin: 11.5 g/dL — ABNORMAL LOW (ref 13.0–17.0)
Hemoglobin: 11.5 g/dL — ABNORMAL LOW (ref 13.0–17.0)
Hemoglobin: 11.7 g/dL — ABNORMAL LOW (ref 13.0–17.0)
Hemoglobin: 12.1 g/dL — ABNORMAL LOW (ref 13.0–17.0)
MCHC: 33.8 g/dL (ref 30.0–36.0)
MCHC: 33.9 g/dL (ref 30.0–36.0)
MCHC: 34 g/dL (ref 30.0–36.0)
MCHC: 34.1 g/dL (ref 30.0–36.0)
MCV: 89.5 fL (ref 78.0–100.0)
MCV: 89.6 fL (ref 78.0–100.0)
MCV: 90.1 fL (ref 78.0–100.0)
MCV: 90.2 fL (ref 78.0–100.0)
MCV: 90.3 fL (ref 78.0–100.0)
Platelets: 287 10*3/uL (ref 150–400)
Platelets: 291 10*3/uL (ref 150–400)
Platelets: 326 10*3/uL (ref 150–400)
Platelets: 335 10*3/uL (ref 150–400)
Platelets: 336 10*3/uL (ref 150–400)
RBC: 3.74 MIL/uL — ABNORMAL LOW (ref 4.22–5.81)
RBC: 3.83 MIL/uL — ABNORMAL LOW (ref 4.22–5.81)
RBC: 3.97 MIL/uL — ABNORMAL LOW (ref 4.22–5.81)
RBC: 4.12 MIL/uL — ABNORMAL LOW (ref 4.22–5.81)
RBC: 4.47 MIL/uL (ref 4.22–5.81)
RDW: 12.9 % (ref 11.5–15.5)
RDW: 13.1 % (ref 11.5–15.5)
RDW: 13.4 % (ref 11.5–15.5)
RDW: 13.5 % (ref 11.5–15.5)
WBC: 11.3 10*3/uL — ABNORMAL HIGH (ref 4.0–10.5)
WBC: 12.3 10*3/uL — ABNORMAL HIGH (ref 4.0–10.5)
WBC: 12.5 10*3/uL — ABNORMAL HIGH (ref 4.0–10.5)
WBC: 14.1 10*3/uL — ABNORMAL HIGH (ref 4.0–10.5)
WBC: 14.3 10*3/uL — ABNORMAL HIGH (ref 4.0–10.5)

## 2011-02-14 LAB — CULTURE, BLOOD (ROUTINE X 2)
Culture: NO GROWTH
Culture: NO GROWTH
Culture: NO GROWTH
Culture: NO GROWTH
Culture: NO GROWTH

## 2011-02-14 LAB — URINE MICROSCOPIC-ADD ON

## 2011-02-14 LAB — BASIC METABOLIC PANEL
BUN: 10 mg/dL (ref 6–23)
BUN: 13 mg/dL (ref 6–23)
BUN: 9 mg/dL (ref 6–23)
CO2: 25 mEq/L (ref 19–32)
CO2: 28 mEq/L (ref 19–32)
CO2: 31 mEq/L (ref 19–32)
Calcium: 8.4 mg/dL (ref 8.4–10.5)
Calcium: 8.5 mg/dL (ref 8.4–10.5)
Chloride: 102 mEq/L (ref 96–112)
Chloride: 99 mEq/L (ref 96–112)
Creatinine, Ser: 0.97 mg/dL (ref 0.4–1.5)
Creatinine, Ser: 1.06 mg/dL (ref 0.4–1.5)
Creatinine, Ser: 1.08 mg/dL (ref 0.4–1.5)
Creatinine, Ser: 1.1 mg/dL (ref 0.4–1.5)
GFR calc Af Amer: >60 mL/min (ref >60)
GFR calc Af Amer: >60 mL/min (ref >60)
GFR calc Af Amer: >60 mL/min (ref >60)
GFR calc non Af Amer: >60 mL/min (ref >60)
GFR calc non Af Amer: >60 mL/min (ref >60)
Glucose, Bld: 106 mg/dL — ABNORMAL HIGH (ref 70–99)
Glucose, Bld: 113 mg/dL — ABNORMAL HIGH (ref 70–99)
Glucose, Bld: 91 mg/dL (ref 70–99)
Potassium: 4 mEq/L (ref 3.5–5.1)
Potassium: 4 mEq/L (ref 3.5–5.1)
Potassium: 4.4 mEq/L (ref 3.5–5.1)
Sodium: 136 mEq/L (ref 135–145)
Sodium: 137 mEq/L (ref 135–145)
Sodium: 137 mEq/L (ref 135–145)

## 2011-02-14 LAB — DIFFERENTIAL
Basophils Absolute: 0 10*3/uL (ref 0.0–0.1)
Basophils Absolute: 0.1 10*3/uL (ref 0.0–0.1)
Basophils Absolute: 0.1 10*3/uL (ref 0.0–0.1)
Basophils Absolute: 0.1 10*3/uL (ref 0.0–0.1)
Basophils Relative: 0 % (ref 0–1)
Basophils Relative: 0 % (ref 0–1)
Basophils Relative: 0 % (ref 0–1)
Basophils Relative: 1 % (ref 0–1)
Basophils Relative: 1 % (ref 0–1)
Eosinophils Absolute: 0.1 10*3/uL (ref 0.0–0.7)
Eosinophils Absolute: 0.1 10*3/uL (ref 0.0–0.7)
Eosinophils Absolute: 0.2 10*3/uL (ref 0.0–0.7)
Eosinophils Absolute: 0.2 10*3/uL (ref 0.0–0.7)
Eosinophils Absolute: 0.2 10*3/uL (ref 0.0–0.7)
Eosinophils Absolute: 0.3 10*3/uL (ref 0.0–0.7)
Eosinophils Relative: 1 % (ref 0–5)
Eosinophils Relative: 1 % (ref 0–5)
Eosinophils Relative: 2 % (ref 0–5)
Eosinophils Relative: 2 % (ref 0–5)
Lymphocytes Relative: 15 % (ref 12–46)
Lymphs Abs: 0.7 10*3/uL (ref 0.7–4.0)
Lymphs Abs: 1 10*3/uL (ref 0.7–4.0)
Lymphs Abs: 1.3 10*3/uL (ref 0.7–4.0)
Lymphs Abs: 1.7 10*3/uL (ref 0.7–4.0)
Lymphs Abs: 1.9 10*3/uL (ref 0.7–4.0)
Monocytes Absolute: 1.1 10*3/uL — ABNORMAL HIGH (ref 0.1–1.0)
Monocytes Absolute: 1.2 10*3/uL — ABNORMAL HIGH (ref 0.1–1.0)
Monocytes Absolute: 1.4 10*3/uL — ABNORMAL HIGH (ref 0.1–1.0)
Monocytes Relative: 10 % (ref 3–12)
Monocytes Relative: 11 % (ref 3–12)
Monocytes Relative: 8 % (ref 3–12)
Neutro Abs: 10.5 10*3/uL — ABNORMAL HIGH (ref 1.7–7.7)
Neutro Abs: 8.8 10*3/uL — ABNORMAL HIGH (ref 1.7–7.7)
Neutro Abs: 9 10*3/uL — ABNORMAL HIGH (ref 1.7–7.7)
Neutrophils Relative %: 71 % (ref 43–77)
Neutrophils Relative %: 74 % (ref 43–77)
Neutrophils Relative %: 77 % (ref 43–77)
Neutrophils Relative %: 78 % — ABNORMAL HIGH (ref 43–77)
Neutrophils Relative %: 80 % — ABNORMAL HIGH (ref 43–77)
Neutrophils Relative %: 85 % — ABNORMAL HIGH (ref 43–77)

## 2011-02-14 LAB — COMPREHENSIVE METABOLIC PANEL
ALT: 12 U/L (ref 0–53)
ALT: 12 U/L (ref 0–53)
AST: 11 U/L (ref 0–37)
Albumin: 3.2 g/dL — ABNORMAL LOW (ref 3.5–5.2)
Alkaline Phosphatase: 68 U/L (ref 39–117)
CO2: 26 mEq/L (ref 19–32)
CO2: 29 mEq/L (ref 19–32)
Calcium: 8.3 mg/dL — ABNORMAL LOW (ref 8.4–10.5)
Chloride: 100 mEq/L (ref 96–112)
Creatinine, Ser: 1.07 mg/dL (ref 0.4–1.5)
GFR calc Af Amer: >60 mL/min (ref >60)
GFR calc non Af Amer: >60 mL/min (ref >60)
GFR calc non Af Amer: >60 mL/min (ref >60)
Glucose, Bld: 121 mg/dL — ABNORMAL HIGH (ref 70–99)
Potassium: 4.2 mEq/L (ref 3.5–5.1)
Sodium: 134 mEq/L — ABNORMAL LOW (ref 135–145)
Sodium: 136 mEq/L (ref 135–145)
Total Bilirubin: 0.7 mg/dL (ref 0.3–1.2)

## 2011-02-14 LAB — BODY FLUID CELL COUNT WITH DIFFERENTIAL
Eos, Fluid: 0 %
Lymphs, Fluid: 8 %
Monocyte-Macrophage-Serous Fluid: 1 % — ABNORMAL LOW (ref 50–90)
Neutrophil Count, Fluid: 91 % — ABNORMAL HIGH (ref 0–25)
Total Nucleated Cell Count, Fluid: 2945 cu mm — ABNORMAL HIGH (ref 0–1000)

## 2011-02-14 LAB — BLOOD GAS, ARTERIAL
Acid-Base Excess: 1.2 mmol/L (ref 0.0–2.0)
Bicarbonate: 24.7 mEq/L — ABNORMAL HIGH (ref 20.0–24.0)
TCO2: 22.3 mmol/L (ref 0–100)
pCO2 arterial: 36.5 mmHg (ref 35.0–45.0)
pH, Arterial: 7.444 (ref 7.350–7.450)
pO2, Arterial: 71.3 mmHg — ABNORMAL LOW (ref 80.0–100.0)

## 2011-02-14 LAB — URINE CULTURE
Colony Count: NO GROWTH
Special Requests: NEGATIVE

## 2011-02-14 LAB — URINALYSIS, ROUTINE W REFLEX MICROSCOPIC
Bilirubin Urine: NEGATIVE
Glucose, UA: NEGATIVE mg/dL
Glucose, UA: NEGATIVE mg/dL
Ketones, ur: NEGATIVE mg/dL
Ketones, ur: NEGATIVE mg/dL
Leukocytes, UA: NEGATIVE
Nitrite: NEGATIVE
Protein, ur: NEGATIVE mg/dL
Protein, ur: NEGATIVE mg/dL
Specific Gravity, Urine: 1.019 (ref 1.005–1.030)
Urobilinogen, UA: 0.2 mg/dL (ref 0.0–1.0)
pH: 5.5 (ref 5.0–8.0)
pH: 7.5 (ref 5.0–8.0)

## 2011-02-14 LAB — POCT I-STAT, CHEM 8
Creatinine, Ser: 1.2 mg/dL (ref 0.4–1.5)
HCT: 44 % (ref 39.0–52.0)
Hemoglobin: 15 g/dL (ref 13.0–17.0)
Potassium: 4.3 mEq/L (ref 3.5–5.1)
Sodium: 135 mEq/L (ref 135–145)
TCO2: 28 mmol/L (ref 0–100)

## 2011-02-14 LAB — PROTEIN, BODY FLUID: Total protein, fluid: 4.6 g/dL

## 2011-02-14 LAB — GLUCOSE, SEROUS FLUID: Glucose, Fluid: 23 mg/dL

## 2011-02-14 LAB — APTT: aPTT: 42 seconds — ABNORMAL HIGH (ref 24–37)

## 2011-02-14 LAB — LACTATE DEHYDROGENASE, PLEURAL OR PERITONEAL FLUID: LD, Fluid: 852 U/L — ABNORMAL HIGH (ref 3–23)

## 2011-02-14 LAB — LACTATE DEHYDROGENASE: LDH: 121 U/L (ref 94–250)

## 2011-02-14 LAB — PH, BODY FLUID: pH, Fluid: 7.13

## 2011-02-14 LAB — PATHOLOGIST SMEAR REVIEW

## 2011-02-14 LAB — BODY FLUID CULTURE

## 2011-02-14 LAB — D-DIMER, QUANTITATIVE: D-Dimer, Quant: 1.6 ug/mL-FEU — ABNORMAL HIGH (ref 0.00–0.48)

## 2011-03-22 NOTE — Letter (Signed)
October 14, 2009   Dario Guardian, M.D.  [address] [place]  [place] [address]   Re:  OMAIR, DETTMER               DOB:  [birth year]   Dear Dr. Katrinka Blazing:   I saw the patient back today.  He is still having some mild dysesthesias  where we did his thoracotomy, but his chest x-ray is markedly improved.  We are going to switch him from Neurontin to Lyrica to see if that will  help these dysesthesias and some post thoracotomy pain.  Overall, though  he is making some improvement.  His blood pressure is 127/87, pulse 101,  respirations 18, sats were 97%.  We will see him back in three months  with another chest x-ray.   Ines Bloomer, M.D.  Electronically Signed   DPB/MEDQ  D:  10/14/2009  T:  10/15/2009  Job:  [id number]

## 2011-03-22 NOTE — Consult Note (Signed)
[name], [name] NO.:  [id number]   MEDICAL RECORD NO.:  [id number]          PATIENT TYPE:  INP   LOCATION:  1518                         FACILITY:  [facility]   PHYSICIAN:  Ines Bloomer, M.D. DATE OF BIRTH:  August 12, 1956   DATE OF CONSULTATION:  DATE OF DISCHARGE:                                 CONSULTATION   CHIEF COMPLAINT:  Shortness of breath.   [name] is a 55 year old man who has a long history of smoking,  started developing left chest pain.  He was not having asymmetrical  density on chest x-ray.  A CT scan confirmed this, and there was a 4-cm  left lingular mass and some questionable hilar and mediastinal  adenopathy.  Admitted to the emergency room on the 24th with bilateral  consolidation, left greater than right and developed a pleural effusion,  which revealed pneumonia in the right middle lobe and left lower lobe,  and left parapneumonic effusion.  Thus, underwent a thoracentesis, which  showed increased protein and white cells.  Cultures are pending.  His  temp has been up to 102.7 today, yesterday it was just 101.  He was on  Unasyn, Avelox, Zosyn, Xanax, Xopenex, Zofran, Thorazine for hiccups,  Robaxin, Lioresal, Clonapam, fentanyl, Protonix, Mobic, OxyContin,  Atrovent.   ALLERGIES:  To DEMEROL.   He has previous pneumonia, has had L1-L2 disk surgery x2, has had  chronic back pain, has been on chronic pain medications.   FAMILY HISTORY:  Noncontributory.   SOCIAL HISTORY:  Lives in [place] alone and he smokes half pack a  day, no alcohol.   REVIEW OF SYSTEMS:  GENERAL:  He has had fever and chills.  CARDIAC:  No  angina or atrial fibrillation.  PULMONARY:  See history of present  illness.  No hemoptysis.  GI:  No nausea, vomiting, constipation,  diarrhea, has recent hiccups.  GU:  No dysuria or frequency.  MUSCULOSKELETAL:  Chronic back pain.  NEUROLOGICAL:  He does have  headaches.  PSYCHIATRIC:  Nervousness.  HEMATOLOGICAL:   No clotting  disorders, anemia.  VASCULAR:  No claudication, DVT, or TIAs.   PHYSICAL EXAMINATION:  VITAL SIGNS:  His temperature is 98, blood  pressure is 140/80, pulse of 100, respirations 18, and sats were 94%.  HEAD, EYES, EARS, NOSE, AND THROAT:  Unremarkable.  NECK:  Supple without thyromegaly.  There is no supraclavicular or  axillary adenopathy.  CHEST:  Wheezes in left lung.  ABDOMEN:  Soft.  EXTREMITIES:  Pulses are 2+.  No clubbing or edema.  NEUROLOGICAL:  He is oriented x3.   IMPRESSION:  1. Left lingular mass.  2. History of tobacco abuse.  3. History of bilateral pneumonia with left parapneumonic effusion.   PLAN:  Repeat CT scan to see if he needs for drainage.  We will probably  need a bronchoscopy at some time for workup of the left lingular mass.      Ines Bloomer, M.D.  Electronically Signed     DPB/MEDQ  D:  05/06/2009  T:  05/06/2009  Job:  [id number]

## 2011-03-22 NOTE — Discharge Summary (Signed)
[name], DURNIL NO.:  [id number]   MEDICAL RECORD NO.:  [id number]          PATIENT TYPE:  INP   LOCATION:  2030                         FACILITY:  [facility]   PHYSICIAN:  Ines Bloomer, M.D. DATE OF BIRTH:  07/31/1956   DATE OF ADMISSION:  05/11/2009  DATE OF DISCHARGE:                               DISCHARGE SUMMARY   FINAL DIAGNOSES:  1. 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  2. Empyema left chest.   IN-HOSPITAL DIAGNOSIS:  In-hospital delirium.   SECONDARY DIAGNOSES:  1. Chronic back pain.  2. Tobacco abuse.   OPERATIONS AND PROCEDURES:  1. Left thoracentesis done on May 03, 2009.  2. Left video-assisted thoracoscopic surgery with left thoracotomy,      drainage of empyema, decortication of left lower lobe left upper      lobe, and resection of left lingular mass.   THE PATIENT'S HISTORY AND PHYSICAL AND HOSPITAL COURSE:  The patient is  a 55 year old gentleman who was recently diagnosed with a lingular lung  mass.  The patient presented to the primary care [name] recently with  left-sided chest pain.  Chest x-ray obtained questionable for pneumonia.  The patient was started on Avelox.  His symptoms persisted and he  presented to the emergency room with worsening pleuritic chest pain,  shortness of breath, and productive cough.  CT scan of chest done in the  ER showed pneumonia as well as a left lingular mass.  He was admitted to  [facility] under hospitalist.  For further details of the patient's  past medical history and physical exam, please see dictated H&P.   The patient was admitted to [facility] on April 29, 2009.  He  was admitted and diagnosed with pneumonia and a left lung mass.  The  patient was started on antibiotics.  Followup chest x-ray showed  increase in his left questionable pleural effusion.  He was sent down to  Radiology where he underwent left thoracentesis on  May 03, 2009, which  removed 300 mL of cloudy brown fluid.  This came back with a reactive  mesothelial cells and acute inflammation.  With continuing patient's  antibiotics, symptoms persisted.  Oncology was consulted and saw and  evaluated the patient on May 04, 2009.  They are concerned that the  left lingular mass possible malignant and recommended bronchoscopy,  biopsy, as well as proceeding with a PET scan.  Prior to this, Pulmonary  saw and evaluated the patient.  They recommended hold off on PET scan,  they did not feel that this was malignant.  Recommended continued  aggressive antibiotics and consult Thoracic Surgery.  The patient's PET  scan was on hold as well as bronchoscopy.  He was continued on IV  antibiotics and Dr. Edwyna Little was consulted.  Dr. Edwyna Little saw and evaluated  the patient.  A CT scan was repeated and done on May 07, 2009, showing  interval increase in loculated pleural fluid collections within the left  lung.  There are new patchy ground-glass opacities within the right  upper lobe and lower lobe consistent with pulmonary edema versus  infection.  There is a small lingular mass anteriorly.  After review of  the patient's CT scan, it was felt that he would benefit from undergoing  a left thoracotomy with decortication and drainage of his empyema as  well as left lingulectomy to rule out malignancy.  Dr. Edwyna Little discussed  risks and benefits with the patient.  The patient acknowledged  understanding agreed to proceed.  Plan was to proceed with surgery at  [name] on May 11, 2009.  Prior to surgery, the patient was  seen and evaluated by Dr. Jeanie Sewer for delirium, anxiety, and  psychosis.  After his evaluation, he felt that the patient had delirium,  not otherwise specified secondary to his severe respiratory infection.  He had improved symptoms.  He did not feel that there is any  antipsychotic treatment required at that time.   The patient was  transferred over to [facility] on May 11, 2009,  where he underwent left video-assisted thoracoscopic surgery with left  thoracotomy and drainage of empyema with decortication left lower lobe  and left upper lobe with resection of left lingular mass.  All cultures  were negative.  His pathology report came back showing bronchiolitis  obliterans organizing pneumonia.  Postoperatively, the patient was  transferred to the intensive care unit in stable condition.  He was able  to be extubated following surgery.  Post extubation, the patient noted  to be alert and oriented x4.  Neuro intact.  He was noted to be  hemodynamically stable.  Postoperatively, daily chest x-rays were  obtained.  The patient had no air leak noted from chest tube.  He had  minimal drainage.  One chest tube was discontinued on postop day #3,  with remaining chest tube discontinued on postop day #4.  Follow up with  PA and lateral chest x-ray prior to discharge home.  During this time,  the patient was encouraged to use his incentive spirometer.  He was able  to be weaned off oxygen sating greater than 90% on room air.  Postoperatively, he remained hemodynamically stable.  He was up  ambulating well with assistance.  He was tolerating diet well.  No  nausea, vomiting noted.  He was initially continued on IV antibiotics  and then switched over to p.o. Augmentin.  All incisions noted to be  clean, dry and intact and healing well.   On postop day #4, the patient noted to be progressing well.  He is  afebrile.  He is in normal sinus rhythm.  Blood pressure is stable.  Lab  work shows white blood cell count is 6.7, hemoglobin 10.4, hematocrit  30, and platelet count 464.  Sodium 138, potassium 4.1, chloride of 100,  bicarbonate 30, BUN of 9, creatinine 0.92, and glucose of 111.  The  patient is tentatively ready for discharge to home in a.m. May 16, 2009, postop day #5.   FOLLOWUP APPOINTMENTS:  Followup  appointment has been arranged with Dr.  Edwyna Little for May 20, 2009, at 4 o'clock p.m.  The patient will need to  obtain PA and lateral chest x-ray 30 minutes prior to this appointment.   ACTIVITY:  Patient instructed no driving until released to do so, no  heavy lifting over 10 pounds.  He is told to ambulate 3-4 times per day  progress as tolerated, and continue using his breathing exercises.   INCISIONAL CARE:  The patient is  told to shower, washing his incisions  using soap and water.  He is to contact the office if he develops any  drainage or opening from any of his incision sites.  Plan to remove the  remainder of his staples in the office.   DIET:  The patient educated on diet to be low-fat, low-salt.   DISCHARGE MEDICATIONS:  1. Clonazepam p.r.n.  2. Flexeril 10 mg p.r.n.  3. Darvocet-N 100 one tablet p.r.n.  4. OxyContin 15 mg b.i.d.  5. __________ 75 mg b.i.d. x7 days.  6. Mobic 7.5 mg b.i.d.        Sol Blazing, PA      Ines Bloomer, M.D.  Electronically Signed    KMD/MEDQ  D:  05/15/2009  T:  05/15/2009  Job:  [id number]   cc:   Antonietta Breach, M.D.  Genene Churn. Cyndie Chime, M.D.  Charlaine Dalton. Sherene Sires, MD, FCCP

## 2011-03-22 NOTE — Assessment & Plan Note (Signed)
OFFICE VISIT   LUTHER, SPRINGS  DOB:  16-Dec-1955                                        July 01, 2009  CHART #:  [id number]   The patient came for followup today and he is doing well overall.  He is  still having a lot of pain, but his incision is well healed.  His chest  x-ray is markedly improved, and I have told him that he could gradually  increase his day activities.  He will continue on the same medications,  at least for now for OxyContin twice a day, Tylox, and Neurontin.  I  will see him back again in 2 months with another chest x-ray.   Ines Bloomer, M.D.  Electronically Signed   DPB/MEDQ  D:  07/01/2009  T:  07/02/2009  Job:  [id number]

## 2011-03-22 NOTE — Letter (Signed)
January 01, 2010   Dario Guardian, MD  [address]  [place], [place] [address]   Re:  [name], [name]               DOB:  17-Jul-1956   Dear Dr. Katrinka Blazing:   The patient came today for final followup.  It has now been about 6-8  months since his surgery.  He still has some paresthesias from his  surgeries, but otherwise, he is doing well.  His incision is well  healed.  His lungs are clear to auscultation and percussion.  His chest  x-ray shows resolution of all of his changes.  He is doing well overall.  I will be happy to see him again if he has any future problems. His  blood pressure was 108/71, pulse 80, respirations 18, and sats were 97%.  He still continues to require some type of pain medication for his  chronic back pain.I hope that the paresthesias resolve in the near  future.   Ines Bloomer, M.D.  Electronically Signed   DPB/MEDQ  D:  01/01/2010  T:  01/02/2010  Job:  [id number]

## 2011-03-22 NOTE — Assessment & Plan Note (Signed)
OFFICE VISIT   [name], [name]  DOB:  December 28, 1955                                        June 03, 2009  CHART #:  [id number]   The patient returned today.  His chest x-ray showed postoperative  changes with further clearing.  He is still having considerable  thoracotomy pain, but is probably improved slightly.  He was given a  refill yesterday for Percocet 05/325, #50 and today we refilled his  OxyContin since he said that his pain doctor was not refilling at the  present time, so I gave him 50 of 10 mg of OxyContin.  I did tell him to  decrease his Neurontin to just 600 or 900 mg at night.  His blood  pressure is 120/72, pulse 80, respirations 18, sats were 96%.  I will  see him back again in 4 weeks with a chest x-ray for further follow up.   Ines Bloomer, M.D.  Electronically Signed   DPB/MEDQ  D:  06/03/2009  T:  06/04/2009  Job:  [id number]

## 2011-03-22 NOTE — Consult Note (Signed)
NAMEKENTARO, Little NO.:  [id number]   MEDICAL RECORD NO.:  [id number]          PATIENT TYPE:  INP   LOCATION:  1518                         FACILITY:  [facility]   PHYSICIAN:  Genene Churn. [place]a, M.D.DATE OF BIRTH:  1956/04/04   DATE OF CONSULTATION:  05/04/2009  DATE OF DISCHARGE:                                 CONSULTATION   This is a Medical Oncology consultation to evaluate this man for an  abnormal CT scan of the left chest.   [name] is a 55 year old man who has smoked 1 pack of cigarettes  daily since he was a teenager.  He presented 2 weeks ago with the acute  onset of left pleuritic chest pain.  He initially thought that this was  due to athletic activity.  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  His  symptoms did not resolve.  He saw his family physician.  A chest  radiograph was done on April 20, 2009.  Findings were an asymmetric  lingular opacity suspicious for pneumonia.  He was started on a course  of oral Avelox.  He subsequently did develop a productive cough.  He  denies any hemoptysis although sputum was rusty colored.  Symptoms  initially improved but then worsened.  A CT scan of the chest was  obtained on April 23, 2009, which I personally reviewed with the  radiologist this morning.  This shows an approximate 4-cm left lingular  mass and borderline hilar (1.3 cm) and mediastinal (1-cm AP window)  adenopathy.  Patient was referred to Dr. Fannie Knee, pulmonologist.  A  bronchoscopy was scheduled but the patient cancelled the procedure.  His  symptoms worsened over the next few days and he presented to the  emergency department and was admitted on April 30, 2009.  A chest x-ray  now showed bilateral bibasilar consolidation greater on the left.  A  repeat CT scan done April 30, 2009, now showed a new left  pleural  effusion and increased prominence of both hilar and mediastinal  lymphadenopathy.  A thoracentesis was done yesterday, May 03, 2009.  Preliminary results show that this is an exudate with protein 4.6 g% and  cell count of 2945 with 91% neutrophils consistent with an empyema.  Pathology is pending.   HOSPITAL COURSE:  Has been one of high fevers up to 102.7 degrees.  He  is currently on Unasyn and IV Avelox.  Neck.   He has had no dysphagia.  No anorexia or weight loss.  He describes the  pain in his left chest as being like raw meat or like he scraped his arm  on the cement.   PAST MEDICAL HISTORY:  No prior cardiopulmonary disease.  A remote  pneumonia treated 20 years ago.  No known emphysema.  No prior  tuberculosis.  No ulcers.  No history of hepatitis, yellow jaundice,  kidney stones, thyroid trouble, seizures, stroke, blood clots.  Only  prior surgery was  an L1-L2 disk surgery requiring a repeat surgery with  an effusion at that level done in [place] in the past.  He has been on  chronic narcotic analgesics for chronic back pain since that time.   MEDS ON ADMISSION:  Included:  1. Darvocet-N 100 p.r.n.  2. OxyContin 15 mg b.i.d.  3. Flexeril 10 mg every 6 hours p.r.n. muscle spasm.  4. Clonazepam 0.5 mg every 6 hours p.r.n.  5. Oral Avelox.   HE IS ALLERGIC TO DEMEROL WITH GI INTOLERANCE AND ANAPHYLAXIS (TROUBLE  BREATHING).   FAMILY HISTORY:  I am his mother's oncologist.  She is currently 10 and  has a remote history of stage II breast cancer currently in remission.   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  One pack per day cigarette smoker.  No alcohol.  He is divorced.  No children.  No chemical or radiation  exposures.   REVIEW OF SYSTEMS:  No severe headache or change in vision.  No  dysphagia.  No dyspnea.  No abdominal  pain.  No change in bowel habit.  No urinary tract symptoms.  No bone pain other than his chronic back  pain.   PHYSICAL EXAMINATION:  Shows a well-nourished, Caucasian man.  Weight  not recorded.  Blood pressure is 128/81.  Pulse 107, regular.  Respirations 18.  Current temp 99.  O2 saturation 93% room air.  Skin,  hair, and nails are normal.  Pharynx with erythema but no exudate.  No  mass.  NECK:  Supple.  Carotids 2+.  No thyromegaly or thyroid mass.  LUNGS:  With decreased breath sounds and rales at the left base.  Dull  to percussion at the left base.  Regular cardiac rhythm.  No murmur or gallop.  No cervical,  supraclavicular, or axillary lymphadenopathy.  ABDOMEN:  Soft and nontender.  No mass.  No organomegaly.  EXTREMITIES:  No edema.  No calf tenderness.  NEUROLOGIC:  Mental status intact.  Cranial nerves grossly normal.  Motor strength 5/5.  Reflexes 1+ symmetric.  Upper body coordination  normal.  Gait not tested.   PERTINENT LAB:  On admission, hemoglobin 15, hematocrit 43, white count  14,000, 77 neutrophils, 12 lymphocytes, platelets 326,000.  Sodium 134,  potassium 4.2, BUN 9, creatinine 1.1, bilirubin 0.7, alkaline  phosphatase 68, SGOT 11, SGPT 12, albumin 3.2, calcium 9.4.   IMPRESSION:  Likely primary lung cancer with associated postobstructive  pneumonia in a 55 year old chronic cigarette smoker.   RECOMMENDATIONS:  I called and discussed his situation with Dr. Jetty Duhamel who will come to the hospital to evaluate the patient today.  We  need diagnostic tissue.  Hopefully the patient will agree to a  bronchoscopy.  If not, then a transcutaneous biopsy of the left lung  mass.   I would go ahead and get a PET scan.  Although there may be some  artifact due to his acute pneumonia, I believe it will still be helpful  in making treatment decisions and in completing a staging evaluation.   Further recommendations pending biopsy results.   Thank you for this  consultation.      Genene Churn. Cyndie Chime, M.D.  Electronically Signed     JMG/MEDQ  D:  05/04/2009  T:  05/04/2009  Job:  [id number]   cc:   Dario Guardian, M.D.  Fax: [phone]   Clinton D. Maple Hudson, MD, FCCP, FACP  [facility]-Pulmonary Dept  [address]  [place]  [place] [address]   [facility], [facility] Team

## 2011-03-22 NOTE — Op Note (Signed)
NAMEJAMONTE, CURFMAN NO.:  [id number]   MEDICAL RECORD NO.:  [id number]          PATIENT TYPE:  INP   LOCATION:  2305                         FACILITY:  [facility]   PHYSICIAN:  Ines Bloomer, M.D. DATE OF BIRTH:  10-14-1956   DATE OF PROCEDURE:  DATE OF DISCHARGE:                               OPERATIVE REPORT   PREOPERATIVE DIAGNOSES:  1. Left lingular mass.  2. Empyema left chest status post right upper lobe, right middle lobe,      and left lower lobe pneumonia.   POSTOPERATIVE DIAGNOSES:  1. Left lingular mass.  2. Empyema left chest status post right upper lobe, right middle lobe,      and left lower lobe pneumonia.   OPERATION PERFORMED:  1. Left VATS.  2. Left thoracotomy.  3. Drainage of empyema.  4. Decortication of left lower lobe, left upper lobe.  5. Resection of left lingular mass.   SURGEON:  Ines Bloomer, MD   FIRST ASSISTANT:  Coral Ceo, PA   General anesthesia.   After percutaneous insertion of all monitoring lines, the patient  underwent general anesthesia and was prepped and draped in the usual  sterile manner.  Two trocar sites were made at the eighth intercostal  space of the posterior axillary line, the seventh intercostal space at  the anterior axillary line.  Two trocars were inserted.  The lung could  be seen with a thick parietal pleura.  We started using the [facility] ring  forceps and took off the parietal pleural laterally, freeing up the  lung, and then superiorly.  We then attempted to take it off medially  off the pleura, but it was stuck that we decided to go ahead and do a  small thoracotomy in the fifth intercostal space.  An 7- to 8-cm  incision was made, and the serratus was split and slightly divided the  latissimus dorsi and then first placed a small retractor and then a  larger 2K retractor in the wound.  We were able to take down the pleura  and then able to free up medially off the mediastinum and took  the  pleura down superiorly and took the lung left lower lobe off the  diaphragm and then we were able to free it up the thickened parietal  pleura laterally off the chest wall.  Finally, we turned our attention  to the fissure, and there was a large abscess between the left upper  lobe and the left lower lobe which is where the major portion of the  empyema was.  This was debrided out and sent for culture.  We then  opened the fissure up completely.  This patient had a 3-cm lesion in the  superior segment of the lingula on a CT scan 3 weeks ago.  The last CT  scan showed it decreased slightly.  We thought he was to be worked up  for a cancer, but we though this might just be inflammatory, so we  resected the lingula up and then resected it with the Autosuture 60  green stapler and sent it  for frozen section.  It turned out to be an  acute and chronic inflammatory mass.  Finally, we turned our attention  to doing the decortication and it was a very thickened peel, we freed it  up off the lower lobe with sharp and blunt dissection using a variety of  techniques and using Kittners and then off the upper lobe which was not  quite as involved as the lower lobe.  After the decortication had been  done, the lung was expanded, the patient had a lot of anthracosis in the  lung and evidence of emphysema.  We took one other area of biopsy with  Autosuture 30 stapler from the left lower lobe.  We placed 3 chest tubes  in the space, one from the anterior and posterior trocar sites and then  between a right-angle chest tube.  All three were 36 sizes.  Marcaine  was done in usual fashion.  A single On-Q was inserted in the usual  fashion.  The chest was closed with four pericostals with #1 Vicryl in  the muscle layer, 2-0 Vicryl in the subcutaneous tissue, and Ethicon  skin clips.  The patient was returned to the recovery room in serious  condition.      Ines Bloomer, M.D.  Electronically  Signed     DPB/MEDQ  D:  05/11/2009  T:  05/12/2009  Job:  [id number]   cc:   Genene Churn. Cyndie Chime, M.D.

## 2011-03-22 NOTE — Consult Note (Signed)
NAME:  [name], [name] NO.:  [id number]   MEDICAL RECORD NO.:  [id number]          PATIENT TYPE:  INP   LOCATION:  NA                           FACILITY:  [facility]   PHYSICIAN:  [name], M.D.  DATE OF BIRTH:  1956-02-23   DATE OF CONSULTATION:  05/07/2009  DATE OF DISCHARGE:                                 CONSULTATION   REASON FOR CONSULTATION:  Delirium, anxiety, psychosis.   REQUESTING PHYSICIAN:  [facility] Team.   HISTORY OF PRESENT ILLNESS:  [name] is a 55 year old male admitted to the [facility] on the April 29, 2009 due to  pneumonia and possible lung mass.  [name] has been experiencing approximately 1 week of memory  impairment.  This progressed to the point of disorientation and periods  of thought disorganization along with waxing and waning, clouding of  consciousness.  At one point he __________ on the floor when his family  was present.  The staff reports today that he has not engaged in any  bizarre behavior.  His memory has been intact so far today and his  orientation has been intact.  He is receiving Xanax 0.5 mg q.8 h p.r.n.   Over the past 24 hours he has received medications that have included  Ambien 5 mg, Robaxin 1000 mg, 150 mcg of  fentanyl, 1 mg of Klonopin,  0.5 mg Xanax.   He is currently cooperative and socially appropriate.  He does have much  worry and some feeling on edge.  However, his mood and future goals are  normal.  He does have constructive future interests.  He discusses his  ongoing enthusiasm for semi-pro football.  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   PAST PSYCHIATRIC HISTORY:  He denies any history of major depression,  prior psychosis, prior delirium.   FAMILY PSYCHIATRIC HISTORY:  None known.   SOCIAL HISTORY:  He has recently been engaged.  However, he states that  he is breaking it off with her due to her lack of support.  There has  been no abuse in the relationship.  [name] denies any alcohol abuse  or illegal drug use.   PAST MEDICAL HISTORY:  1. Delirium.  2. Pneumonia.  3. Rule out lung mass.   ALLERGIES:  DEMEROL.   MEDICATIONS:  MAR is reviewed.  He is on:  1. Xanax 0.5 mg q.8 h p.r.n.  2. Thorazine 25 mg q.6 h p.r.n.  3. Klonopin 1 mg b.i.d. p.r.n.  4. Ambien 5 mg at bedtime p.r.n.   LABORATORY DATA:  Sodium 137, BUN 12, creatinine 0.97, WBC 8.9,  hemoglobin 11.7, platelet count 317,000, SGOT 13, SGPT 12. Head CT  without contrast was unremarkable. EKG: QTC 403 milliseconds.   REVIEW OF SYSTEMS:  Constitutional, head, eyes, ears, nose, throat,  mouth, neurologic, psychiatric, cardiovascular, respiratory,  gastrointestinal, genitourinary, skin, musculoskeletal, hematologic,  lymphatic, endocrine, metabolic, all unremarkable.   PHYSICAL EXAMINATION:  VITAL SIGNS:  Temperature 98.6, pulse 92,  respiratory rate 20, blood pressure 121/74, oxygen saturation on  room  air 95%.  GENERAL APPEARANCE:  [name] is a middle-aged male sitting up in his  hospital bed with no abnormal involuntary movements.   MENTAL STATUS EXAM:  [name] is alert.  He has good eye contact.  His attention span is normal.  His affect is slightly anxious at  baseline but with a broad appropriate range.  His mood is within normal  limits.  His concentration is normal.  He is oriented to all spheres.  His memory is intact to immediate, recent and remote except for the  period of delirium.  His fund of knowledge and intelligence are within  normal limits.  His speech involves normal rate and prosody.  There is  no dysarthria.  Thought process is logical, coherent, goal-directed.  No  looseness of associations.  Thought content:  No thoughts of harming  himself.  No thoughts of harming others. No delusions or hallucinations.  His insight is intact.  Judgment is intact.   ASSESSMENT:  AXIS I:  1. 293.00.  Delirium not  otherwise specified.  It appears that Mr.      [name] developed delirium secondary to his severe respiratory      infection.  He also does have anemia and has required pain      medication.  His improvement in delirium is correlated with      receiving his antibiotic therapy for his pneumonia.  2. 293.84.  Anxiety disorder not otherwise specified.  This likely      represents some reactive anxiety and will likely not require any      ongoing therapy.  His anxiety already is subsiding  AXIS II:  Deferred.  AXIS III:  See past medical history.  AXIS IV:  General medical primary support group.  AXIS V:  Currently 55.  However, by the definition of delirium, it can  wax and wane in a 3-step-forward  and 1-or-2-step backward fashion.   RECOMMENDATIONS:  No antipsychotic is required at this time.   If [name] does redevelop delusions, thought disorganization,  hallucinations, catastrophic anxiety, would start Zyprexa 5 mg p.o. or  IM q. 1800 and monitor for any stiffness or other extrapyramidal side  effects.  Would keep memory and orientation cues in the room and  continue with a quiet ego supportive environment.  Once his delirium is confirmed consistently resolved, he should not  require any outpatient psychiatric followup.      [name], M.D.  Electronically Signed     JW/MEDQ  D:  05/09/2009  T:  05/10/2009  Job:  [id number]

## 2011-03-22 NOTE — Consult Note (Signed)
[name], [name] NO.:  [id number]   MEDICAL RECORD NO.:  [id number]          PATIENT TYPE:  INP   LOCATION:  1518                         FACILITY:  [name]   PHYSICIAN:  Charlaine Dalton. Sherene Sires, MD, FCCPDATE OF BIRTH:  1956-02-12   DATE OF CONSULTATION:  DATE OF DISCHARGE:                                 CONSULTATION   CONSULTING PHYSICIAN:  [facility] team blue.   Bradley CARE PHYSICIAN:  Bradley. Katrinka Blazing of [place].   CHIEF COMPLAINT:  Lung mass vs pneumonia.   HISTORY OF PRESENT ILLNESS:  [name] is a 55 year old white male  smoker with little medical history who was admitted on June 24 with  chief c/o cp.  He reports on June 10 , he was lifting weights with his  friends and felt he had a torn muscle in his chest and went to his  [name] 6/14 who evaluated him with a chest x-ray and was  found to have ?pneumonia on chest x-ray.  At that time he was given  Avelox for pneumonia.  By 6/17 he reports feeling no better and his  [name] sent him for CT of the chest that revealed a  lingular lung mass and was seen by [name] who rec a PET scan.  In the  meantime pt developed cough with brown sputum production shaking chills  and temp of 102 at home with refractory pain and therefore admitted to  [name] and Pulmonary consultation requested.  He denies sinus or dental  problems, ethohism or sz disorder but uses narcotics daily for chronic  back pain. no unusual exposures or travel or pet hx.   PAST MEDICAL SURGICAL HISTORY:  1. Chronic back pain secondary to back surgery x2.  2. Pneumonia in November of 2009.   SOCIAL HISTORY:  [name] lives in [place] alone.  He reports he  is self-employed and works as a Psychologist, occupational for a [employer].  He  is a current smoker of 1/2 pack to a pack per day for 35 years.  He  denies alcohol or drug use and exercises up to 6x a week.  He also  denies herbal medications.   FAMILY HISTORY:  His  mother is living at 80 years of age and has breast  cancer, diabetes type 1, hypertension and rheumatoid arthritis.  His  father also is alive and well with back surgery x2 as his only medical  history.  He has 2 brothers, ages 83 and 76 who are reportedly healthy.   HOME MEDICATIONS:  1. OxyContin 20 mg b.i.d.  2. Darvocet p.r.n. for pain.  3. Flexeril 10 mg p.r.n.  4. As per HPI has been on Avelox as an outpatient prior to hospital      admission.   REVIEW OF SYSTEMS:  The patient is full code.  GENERAL:  Reports fevers  and chills.  The patient also reports that in December he weighed  approximately 200 pounds and today's weight is 196 pounds.  He  attributes this to his seasonal weight fluctuations with being outside  working with  players in the heat.  HEENT:  Reports headaches.  Denies  nasal discharge, nose bleeds, voice changes, vertigo.  SKIN:  Denies  rashes or lesions.  CARDIOVASCULAR/PULMONARY:  Denies shortness of  breath, dyspnea on exertion, wheezing, edema, palpitations and syncope.  He does report currently experiencing pleuritic chest pain on the left  side 6/10 that increases with his hiccoughs he has been experiencing  over the last 2 days to 9/10.  He also reports a productive cough with  brown sputum in the early a.m. that progresses to white by the end of  the day.  Denies hemoptysis.  GU:  He denies frequency, urgency, dysuria  and hematuria.  GI:  Denies nausea, vomiting, diarrhea, melena, bowel  changes and abdominal pain and GERD symptoms.  ENDOCRINE:  Denies  polyuria, polydipsia, heat or cold intolerances and skin or hair  changes.  All other systems are reviewed and are negative.   PHYSICAL EXAMINATION:  VITAL SIGNS:  Hospital admission T max 102.6,  currently 98.5.  Blood pressure 107/66, heart rate 97, respirations 18,  oxygen saturation 97% on 2 liters per cannula.  Weight 196 pounds.  GENERAL:  A well-developed adult male in no acute distress.   NEUROLOGIC:  Awake, alert and oriented.  Cranial nerves II-XII grossly  intact.  Strength 5/5 in all extremities.  Speech is clear.  Normal  sensation.  HEENT:  Normocephalic and atraumatic.  Pupils are equal, round and  reactive.  EOMs are intact.  Sclerae are clear.  Mucous membranes are  pink and moist.  Good dentition.  NECK:  The neck is supple without lymphadenopathy or thyromegaly.  No  bruits auscultated.  No JVD noted.  CARDIOVASCULAR:  S1, S2, regular rate and rhythm, no murmurs, rubs or  gallops.  Pulses, radial pulses are +2 and equal bilaterally.  PULMONARY:  Respirations are even and nonlabored.  Left lung is noted to  have wheezing and crackles anterior lateral and posterior lower lobe.  Right is essentially clear. Classic BV bs at left mid post chest  GI:  Abdomen is soft and nontender to palpation.  Bowel sounds x4 are  active.  MUSCULOSKELETAL:  Moves all extremities.  No joint deformity or lesions  noted.  SKIN:  No rashes or lesions.   RADIOGRAPHIC DATA:  On June 24 chest x-ray reveals bibasilar  consolidation left greater than right.  June 24 CT of the chest reveals  no pulmonary embolism.  Left lower lobe and right middle lobe pneumonia  with a lingular mass and noted increase in hilar nodes that was thought  to be reactive assoc with small left pleural effusion  On June 24 CT of  the head reveals no acute findings.   LABORATORY DATA:  BMP reveals sodium 133, potassium 4.4, chloride 96,  bicarbonate 30, BUN 13, creatinine 1.06, glucose 106.  CBC reveals white  count of 15.9, platelets 287, hemoglobin 13.1, hematocrit 38.4.   MICRO DATA:  On June 23 urine culture is negative.  June 23 urinalysis  is negative.  June 24 blood cultures with no growth to date with pending  final results.   IMPRESSION AND PLAN:  CAP not responding convincingly to avelox  previously (not sure how many he really took) now with ? evolving acute  lung injury and left effusion ? empyema  forming in pt who uses narcotics  daily for low back pain so asp a concern.  Agree with Zosyn but no need  for vanc here.  I see  no evidence at all for a post obstructive process  or lung mass here and note the bronchovesicular bs on exam and subtle  air bronchograms on ct rule out an obstructive process here, despite his  smoking hx and risk for neoplasm.  PET scan will not r/o ca in this  setting and should be postponed for at least 6 weeks for serial cxr  comparison and if there is progressive improvement with rx of pna would  postpone this indefinitely.   May need early thoracentesis if fluid layers on lateral view to r/o  empyema so cxr's ordered for 6/26. In meantime best to rx pain with  mobic to minimize need for more narcotics here.   Best practice, the patient will be placed on Protonix for  gastrointestinal prophylaxis and heparin subcutaneously for deep venous  thrombosis prophylaxis.      Canary Brim, NP      Charlaine Dalton. Sherene Sires, MD, [facility]  Electronically Signed    BO/MEDQ  D:  05/01/2009  T:  05/01/2009  Job:  [id number]

## 2011-03-22 NOTE — H&P (Signed)
NAME:  [name] NO.:  [id number]   MEDICAL RECORD NO.:  [id number]          PATIENT TYPE:  EMS   LOCATION:  ED                           FACILITY[facility]  [facility]   PHYSICIAN:  Michiel Cowboy, MDDATE OF BIRTH:  1955-12-30   DATE OF ADMISSION:  04/29/2009  DATE OF DISCHARGE:                              HISTORY & PHYSICAL   PRIMARY CARE PHYSICIAN:  Stacie Acres. White, M.D.   CHIEF COMPLAINT:  Pleuritic chest pain.   HISTORY OF PRESENT ILLNESS:  The patient is a 55 year old gentleman  recently diagnosed with a lingular lung mass.  The patient presented to  his primary care Dillen Belmontes about 2 weeks ago with left-sided chest pain.  She noticed was worse when he laid down or took a deep breath.  A chest  x-ray was obtained that could not rule out a pneumonia.  He was started  on Avelox of note back then.  He did not have a fever or cough.  He  almost completed 10 days of Avelox and then today started to develop a  fever in the ED up to 101.5  as well as sudden worsening of his  pleuritic chest pain, with taking a deep breath, more severe pain and  also made worse when he coughed.  He may have had a little bit of blood-  tinged sputum at which point he presented to the emergency department.  Of note, on June 17, given the patient's  continued pleuritic pain, CT  scan of the chest was obtained that did show that there was not only  pneumonia but a lingual mass.  The chest x-ray done today showing  worsening of that infiltrate, could be consistent with now developed a  lingular pneumonia versus collapse with possible bilateral involvement.   The patient received Dilaudid in the emergency department with some pain  control but continues to be in some pain.  Otherwise as above.  No other  review of systems are positive.  The patient has had no nausea, no  vomiting.  No diarrhea.  No constipation.  He has chronic back pain.   PAST MEDICAL HISTORY:  Past medical history is  significant for chronic  back pain, otherwise none.   SOCIAL HISTORY:  The patient used to smoke up until now, but we will  insists on quitting.  Does not drink or abuse drugs.   FAMILY HISTORY:  Significant for mother with breast cancer.  Otherwise  unremarkable.   ALLERGIES:  DEMEROL.   MEDICATIONS:  1. OxyContin 20 mg twice a day.  2. Darvocet as needed.  3. Flexeril 10 mg as needed.  4. Clonazepam 0.5 at bedtime.  5. Avelox 400 mg daily.   PHYSICAL EXAMINATION:  VITALS:  Temperature 101.5, blood pressure  144/93, pulse 117, respirations 21, satting 96% on room air.  GENERAL:  The patient appears to be still in some degree of pain when  taking deep breaths.  HEENT:  Head nontraumatic.  Moist mucous membranes.  LUNGS:  The patient able to take good deep breaths for evaluation.  HEART:  Rapid but regular.  No murmurs appreciated.  ABDOMEN:  Soft, nontender.  LOWER EXTREMITIES:  Without clubbing, cyanosis or edema.  NEUROLOGIC:  Intact.  SKIN:  Clean, dry and intact.  Marland Kitchen   LABORATORY DATA:  White blood cell count 14.1, hemoglobin 14.8.  Chemistries not obtained.  Urinalysis showing some blood with 11-20  rbc's.  CT scan from June 17 shows a 3 x 3 cm lingular mass.  Chest x-  ray today showing bilateral consolidation.  EKG showing sinus  tachycardia, rate 110, and no evidence of ischemic changes.   ASSESSMENT/PLAN:  This is a 55 year old gentleman with a lung mass, now  presents with pneumonia.  I think initially he was misdiagnosed with  pneumonia but did receive Avelox prior to it.  He does have a pneumonia,  probably more postobstructive.  1. Pneumonia likely postobstructive.  Will treat with Zosyn.  I wonder      if he would need a pulmonary consult for biopsy in the future.  Dr.      Maple Hudson was supposed to see the patient.  2. Given sudden onset of worsening of pleuritic chest pain,      tachycardia, mild hemoptysis, and history of lung mass,      differential is a  possible explanation for some of his symptoms.      Will obtain a D-dimer and if positive, will  likely benefit from CT      angiogram of the chest.  3. Chronic pain.  Will continue OxyContin and Dilaudid p.r.n.  4..  For prophylaxis, Protonix plus SCDs.  1. Lung mass.  While the patient was supposed to have a PET scan done      tomorrow, I am not sure if he  would benefit from having a PET scan      done while he may have a pneumonia.  And the possibility would      defer to  pulmonary versus oncology for making this decision.  Was      recommended oncology consult.  Of note, per CT scan, mo possibility      of involvement of his liver.Michiel Cowboy, MD  Electronically Signed     AVD/MEDQ  D:  04/30/2009  T:  04/30/2009  Job:  [id number]   cc:   Dario Guardian, M.D.  Fax: [phone]

## 2011-03-22 NOTE — Assessment & Plan Note (Signed)
OFFICE VISIT   Bradley, Little  DOB:  03-Dec-1955                                        August 12, 2009  CHART #:  [id number]   The patient went to one pain medication center and did not like the  doctor, so he is going to another one tomorrow.  He is still having some  mild-to-moderate postthoracotomy pain, but this has improved with  Neurontin, and we will continue him on Neurontin.  His chest x-ray is  markedly improved.  His lungs are clear to auscultation and percussion.  We plan to see him back again in 2 months with another chest x-ray.   Ines Bloomer, M.D.  Electronically Signed   DPB/MEDQ  D:  08/12/2009  T:  08/13/2009  Job:  [id number]

## 2011-03-22 NOTE — Letter (Signed)
May 20, 2009   Dario Guardian, MD  [address]  [place], [place] [address]   Re:  GIO, JANOSKI               DOB:  1956-05-17   Dear Dr. Katrinka Blazing:   I saw the patient in the office today after his surgery.  We did a left  VATS, left thoracotomy, and decortication of the left lower lobe for  empyema.  We also did a wedge resection of a left lingular lesion.  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  No evidence of cancer.  He  returns today and has big issues with pain.  His incisions are well  healed, and I removed his staples as well as chest tube sutures.  Chest  x-ray showed a reaction on the left base but this was improving.  He has  apparently had chronic pain issues prior to surgery because of multiple  back surgeries.  He was previously on Flexeril, Mobic, and OxyContin,  and we had him continue this.  We stopped his Darvocet and put him on  Percocet 5/325 one or two every 6 hours for breakthrough pain.  They  also started him on Neurontin 300 mg twice a day and he will increase it  to 600 mg twice a day.  I will plan to see him back again in 3 weeks  with a chest x-ray.   Ines Bloomer, M.D.  Electronically Signed   DPB/MEDQ  D:  05/20/2009  T:  05/21/2009  Job:  [id number]

## 2011-03-25 NOTE — Assessment & Plan Note (Signed)
[facility]                           [facility] OFFICE NOTE   NAME:[name], [name]                      MRN:          [id number]  DATE:12/28/2006                            DOB:          Jan 12, 1956    Mr. Brar is a 56 year old white male who comes to establish with the  practice due to recent exposure to genital herpes by his significant  other.  He indicates that he has had no outbreak.  Exposure has been  over the last 6 months.  She is currently being treated on an episodic  basis.   He generally sees Dr. Ladona Ridgel in [facility].   CURRENT MEDICATIONS:  1. Flexeril 10 mg p.r.n.  2. Darvocet p.r.n.  3. OxyContin p.r.n.   ALLERGIES:  DEMEROL causes convulsions.   PAST MEDICAL HISTORY:  He indicates that he has basically been well with  no acute or chronic illnesses.   SURGERIES:  Have included two fusions to include L3-4-5 done in  [place], [place], several years ago.  He has also had  bilateral inguinal hernia repairs between the ages of 89 and 75 and a TNA  as a child.  He has been hospitalized for surgery only.   SOCIAL HISTORY:  He is separated, soon to be divorced.  He has no  children.  Currently is attending [employer], majoring in psychology.  Plans  to do a law degree following this.  Exercise is occasional.  He smokes  one pack of cigarettes a day, occasional alcohol.   REVIEW OF SYSTEMS:  Basically negative, including cardiovascular,  respiratory, GI.   FAMILY HISTORY AND IMMUNIZATION HISTORY:  Not obtained at this time due  to the emotional overlay of this visit.   ASSESSMENT:  VITAL SIGNS:  Blood pressure 130/104, temperature 98.0,  pulse is 88, weight is 206, height 5 feet 5.5 inches.  GENERAL:  A very muscular white male in no acute distress, though is  anxious.  CHEST:  Clear throughout.  No rales, rhonchi or wheezes.  HEART:  Rate and rhythm regular without murmurs, gallops or rubs.  No  carotid bruits.  MUSCULOSKELETAL:  Muscle mass well developed in his chest and upper  extremities.  Gait and station within normal limits.  SKIN:  Without obvious lesion in exposed areas.  PSYCHIATRIC:  Oriented x3.  Verbalizes easily.  Verbalizes his feelings  fluently.   ASSESSMENT:  Exposure to genital herpes.   PLAN:  Will get a herpes titer to include acute and chronic type 1 and  2, notify him of the results.  Will treat as needed.  Handout and DVD  for educational purposes have been given.  A long discussion with both  he and his significant other in the room for more than 35 minutes.  Will  see back as needed as he will continue to get his care in [facility].      Billie D. Bean, FNP  Electronically Signed      Arta Silence, MD  Electronically Signed   BDB/MedQ  DD: 12/29/2006  DT: 12/29/2006  Job #: [id number]

## 2011-03-25 NOTE — Assessment & Plan Note (Signed)
[facility]                           [facility]ICE NOTE   NAME:[name]                      MRN:          [id number]  DATE:12/28/2006                            DOB:          1956/10/30    [name] is a 55 year old white male who comes to establish with the  practice accompanied by his significant other.   He indicates that she recently made him aware that she has genital  herpes. He indicates that he has had no outbreaks. There is much  emotional overlay between the two of them.   He sees Dr. Ladona Ridgel in [facility] on a regular basis.   CURRENT MEDICATIONS:  1. Flexeril 10 mg p.r.n.  2. Darvocet p.r.n.  3. OxyContin one daily p.r.n.   ALLERGIES:  DEMEROL CAUSES CONVULSIONS.   PAST MEDICAL HISTORY:  Indicates that he has basically been well.  1. He has had a fusion of L3, 4 and 5 done in [place], [place] some years ago. He continues to have low back pain.  2. He has bilateral inguinal hernia repairs between the ages of 77 and      24 and 30 as a child. He has been hospitalized for surgery only.   SOCIAL HISTORY:  He is divorced and has no children. 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 He occasionally works out. Smokes  a pack of cigarettes a day. Occasionally uses alcohol and does not use  street drugs.   REVIEW OF SYSTEMS:  Denies any cardiovascular, respiratory, GI problems.  Musculoskeletal as above.   FAMILY HISTORY AND IMMUNIZATION RECORD:  Will be completed on his next  visit.   PHYSICAL EXAMINATION:  Blood pressure 140/104. Temperature 98.0. Pulse  is 88. Weight is 206. Height 5 feet and 5-1/2 inches.  GENERAL: Quite muscularly developed upper extremities and chest in no  acute distress.  CHEST: Is clear throughout. No rales, rhonchi or wheezes.  HEART: Rate and rhythm regular without murmurs, gallops or rubs. No  carotid bruits.  MUSCULOSKELETAL: As indicated. He is quite muscular.  Gait and station  within normal limits.  SKIN: Is without obvious lesions in the exposed areas.  PSYCHIATRIC: Oriented x3. Verbalizes easily.   ASSESSMENT:  Exposure to genital herpes.   PLAN:  Will get herpes titers today and notify of the findings and treat  as needed. Have given he and his significant other handout and DVD  regarding genital herpes. Long discussion was had regarding  transmission, treatment and emotional overlay which is quite obvious  between the two of them. I will see him back as needed. Note, was in the  room from 35-40 minutes.      Billie D. Bean, FNP  Electronically Signed      Arta Silence, MD  Electronically Signed   BDB/MedQ  DD: 01/04/2007  DT: 01/04/2007  Job #: [id number]

## 2011-07-07 ENCOUNTER — Emergency Department (HOSPITAL_COMMUNITY)
Admission: EM | Admit: 2011-07-07 | Discharge: 2011-07-08 | Disposition: A | Discharge status: Home / Self Care | Payer: Medicare Other | Attending: Emergency Medicine | Admitting: Emergency Medicine

## 2011-07-07 DIAGNOSIS — H53149 Visual discomfort, unspecified: Secondary | ICD-10-CM | POA: Insufficient documentation

## 2011-07-07 DIAGNOSIS — R51 Headache: Secondary | ICD-10-CM | POA: Insufficient documentation

## 2011-07-07 DIAGNOSIS — R110 Nausea: Secondary | ICD-10-CM | POA: Insufficient documentation

## 2011-07-07 DIAGNOSIS — M129 Arthropathy, unspecified: Secondary | ICD-10-CM | POA: Insufficient documentation

## 2011-07-07 DIAGNOSIS — Z9181 History of falling: Secondary | ICD-10-CM | POA: Insufficient documentation

## 2012-03-06 ENCOUNTER — Emergency Department (HOSPITAL_COMMUNITY)
Admission: EM | Admit: 2012-03-06 | Discharge: 2012-03-07 | Disposition: A | Discharge status: Home / Self Care | Payer: Medicare Other | Attending: Emergency Medicine | Admitting: Emergency Medicine

## 2012-03-06 ENCOUNTER — Emergency Department (HOSPITAL_COMMUNITY): Payer: Medicare Other

## 2012-03-06 ENCOUNTER — Encounter (HOSPITAL_COMMUNITY): Payer: Self-pay | Admitting: Family Medicine

## 2012-03-06 DIAGNOSIS — R071 Chest pain on breathing: Secondary | ICD-10-CM | POA: Insufficient documentation

## 2012-03-06 DIAGNOSIS — W2203XA Walked into furniture, initial encounter: Secondary | ICD-10-CM | POA: Insufficient documentation

## 2012-03-06 DIAGNOSIS — S9032XA Contusion of left foot, initial encounter: Secondary | ICD-10-CM

## 2012-03-06 DIAGNOSIS — R0789 Other chest pain: Secondary | ICD-10-CM

## 2012-03-06 DIAGNOSIS — Z79899 Other long term (current) drug therapy: Secondary | ICD-10-CM | POA: Insufficient documentation

## 2012-03-06 DIAGNOSIS — S9030XA Contusion of unspecified foot, initial encounter: Secondary | ICD-10-CM | POA: Insufficient documentation

## 2012-03-06 MED ORDER — KETOROLAC TROMETHAMINE 60 MG/2ML IM SOLN
60.0000 mg | Freq: Once | INTRAMUSCULAR | Status: AC
  Administered 2012-03-06: 60 mg via INTRAMUSCULAR
  Filled 2012-03-06: qty 2

## 2012-03-06 NOTE — ED Provider Notes (Signed)
History     CSN: [id number]  Arrival date & time 03/06/12  2111   First MD Initiated Contact with Patient 03/06/12 2330      Chief Complaint  Patient presents with  . Foot Pain    (Consider location/radiation/quality/duration/timing/severity/associated sxs/prior treatment) HPI Comments: Bumped the top of his foot on a vanity which started 3 days ago - sx are constant, gradually worsening and associated with a small bump to the top of the foot.  No other swelling, or other c/o.  He states that he has chronic sx in his legs which cause his legs to give out frequently making him fall - he is under the care of the [facility] for his care.  Patient is a 56 y.o. male presenting with lower extremity pain. The history is provided by the patient and a friend.  Foot Pain This is a new problem. The current episode started 2 days ago. The problem occurs constantly. The problem has been gradually worsening.    History reviewed. No pertinent past medical history.  Past Surgical History  Procedure Date  . Lung removal, partial   . Knee surgery   . Back surgery   . Hernia repair     No family history on file.  History  Substance Use Topics  . Smoking status: Current Everyday Smoker -- 1.0 packs/day    Types: Cigarettes  . Smokeless tobacco: Not on file  . Alcohol Use: No      Review of Systems  Musculoskeletal: Negative for joint swelling.  Skin: Negative for rash.  Neurological: Negative for weakness and numbness.    Allergies  Dilaudid and Meperidine hcl  Home Medications   Current Outpatient Rx  Name Route Sig Dispense Refill  . AMPHETAMINE-DEXTROAMPHET ER 30 MG PO CP24 Oral Take 60 mg by mouth daily.    . CYCLOBENZAPRINE HCL 10 MG PO TABS Oral Take 10 mg by mouth 3 (three) times daily as needed. For muscle pain.    Marland Kitchen OMEGA-3 FATTY ACIDS 1000 MG PO CAPS Oral Take 1 g by mouth daily.    Marland Kitchen FLUOXETINE HCL 40 MG PO CAPS Oral Take 40 mg by mouth daily.    . FUROSEMIDE 20  MG PO TABS Oral Take 20 mg by mouth daily.    Marland Kitchen GABAPENTIN 300 MG PO CAPS Oral Take 300 mg by mouth 3 (three) times daily.    . ADULT MULTIVITAMIN W/MINERALS CH Oral Take 1 tablet by mouth 2 (two) times daily.    Marland Kitchen OLANZAPINE 10 MG PO TABS Oral Take 10 mg by mouth at bedtime.    . OXYCODONE HCL 30 MG PO TABS Oral Take 30 mg by mouth every 6 (six) hours as needed. For pain.    . OXYCODONE HCL ER 60 MG PO TB12 Oral Take 1 tablet by mouth 2 (two) times daily.    Marland Kitchen POTASSIUM CHLORIDE CRYS ER 20 MEQ PO TBCR Oral Take 20 mEq by mouth daily.    . SUMATRIPTAN SUCCINATE 50 MG PO TABS Oral Take 50 mg by mouth every 2 (two) hours as needed. For migraines.    . TRAZODONE HCL 50 MG PO TABS Oral Take 50-100 mg by mouth at bedtime as needed.    . VENLAFAXINE HCL ER 37.5 MG PO CP24 Oral Take 37.5 mg by mouth daily.    Marland Kitchen VILAZODONE HCL 40 MG PO TABS Oral Take 40 mg by mouth daily.    Marland Kitchen HYDROCODONE-ACETAMINOPHEN 5-325 MG PO TABS Oral Take 2 tablets  by mouth every 4 (four) hours as needed for pain. 6 tablet 0  . METHOCARBAMOL 500 MG PO TABS Oral Take 1 tablet (500 mg total) by mouth 2 (two) times daily as needed. 20 tablet 0  . NAPROXEN 500 MG PO TABS Oral Take 1 tablet (500 mg total) by mouth 2 (two) times daily with a meal. 30 tablet 0    BP 103/64  Pulse 83  Temp(Src) 98.5 F (36.9 C) (Oral)  Resp 20  SpO2 99%  Physical Exam  Nursing note and vitals reviewed. Constitutional: He appears well-developed and well-nourished. No distress.  HENT:  Head: Normocephalic and atraumatic.  Eyes: Conjunctivae are normal. No scleral icterus.  Cardiovascular: Normal rate, regular rhythm and normal heart sounds.   Pulmonary/Chest: Effort normal and breath sounds normal. No respiratory distress. He has no wheezes. He has no rales.  Musculoskeletal: He exhibits tenderness ( bilateral knee braces on has mild area of ttp over the dorsum of the L foot - there is a mobile mass under the skin - no erythema, no peau'dorange,  no ttp over the skin.  ).       Normal ROM of the ankles bil without swelling.  Neurological: He is alert. Coordination normal.  Skin: Skin is warm and dry. No rash noted. He is not diaphoretic.    ED Course  Procedures (including critical care time)  Labs Reviewed - No data to display Dg Foot Complete Left  03/07/2012  *RADIOLOGY REPORT*  Clinical Data: Post fall, now with contusion to the dorsum and lateral aspect of the foot  LEFT FOOT - COMPLETE 3+ VIEW  Comparison: 11/22/2010; ankle radiographs - 10/06/2004  Findings:  No definite fracture or dislocation, though note, the AP radiograph is slightly limited due to obliquity.  There is soft tissue swelling about the dorsum of the foot.  No radiopaque foreign body. Joint spaces are preserved.  IMPRESSION: Soft tissue swelling about the dorsum of foot without associated fracture or radiopaque foreign body.  Original Report Authenticated By: Waynard Reeds, M.D.     1. Contusion of left foot   2. Chest wall pain       MDM  Area of contusion to the top of the foot - area appears inflamed - could be c/w phlebitis of vein - has normal pulses at the foot otherwise.  Image to r/o frx underlying.  Pt states also has some chest cramping which started 3 days ago with fall - has ttp on exam over the chest wall.   Imaging neg for frx.  Discharge Prescriptions include:  Naprosyn Hydrocodone Robaxin   Vida Roller, MD 03/07/12 [phone]

## 2012-03-06 NOTE — ED Notes (Signed)
Patient c/o left foot pain. States he hit vanity a few days ago.

## 2012-03-07 MED ORDER — METHOCARBAMOL 500 MG PO TABS: 500.0000 mg | ORAL_TABLET | Freq: Two times a day (BID) | ORAL | Status: AC | PRN

## 2012-03-07 MED ORDER — NAPROXEN 500 MG PO TABS: 500.0000 mg | ORAL_TABLET | Freq: Two times a day (BID) | ORAL | Status: DC

## 2012-03-07 MED ORDER — HYDROCODONE-ACETAMINOPHEN 5-325 MG PO TABS: 2.0000 | ORAL_TABLET | ORAL | Status: AC | PRN

## 2012-03-07 NOTE — Discharge Instructions (Signed)
Your xrays are negative for fracture - use ice packs - meds as prescribed  RESOURCE GUIDE  Dental Problems  Patients with Medicaid: [facility] [id number]                                           [phone] [address] Phone:  [phone]                                                  Phone:  [phone]  If unable to pay or uninsured, contact:  [facility] or [facility]. to become qualified for the adult dental clinic.  Chronic Pain Problems Contact [facility]  [phone] Patients need to be referred by their primary care doctor.  Insufficient Money for Medicine Contact United Way:  call "211" or [facility] [phone].  No Primary Care Doctor Call [facility]  [phone] Other agencies that provide inexpensive medical care    [facility]  [phone]    [facility]  [phone]    [facility]  [phone]    [facility]  [phone]    [facility]  [phone]    [facility]  [phone]  Psychological Services [facility]  [phone] [facility]  [phone] [facility]   [phone] (emergency services [phone])  Substance Abuse Resources [facility]  [phone] [facility] 250-688-1366 [facility] [phone] Floyd[facility] Flock [phone] Residential & Outpatient Substance Abuse Program  [phone]  Abuse/Neglect [facility] Child Abuse Hotline 509-562-6645 [facility] Child Abuse Hotline [phone] (After Hours)  Emergency Shelter [facility] [phone]  Maternity Homes [facility] [phone] [facility] [phone]  MRSA Hotline #:   (814)224-1456    [facility]  [facility]     [employer]                          [facility] Dept. [address] [place]                       [address]      [address]                                                  Cristobal [place]mann Phone:  [phone]                                   Phone:  [phone]  Phone:  [phone]  [facility] Phone:  [phone]  [facility] Child Abuse Hotline 862-395-5673 [phone] (After Hours)

## 2012-11-05 ENCOUNTER — Emergency Department (HOSPITAL_COMMUNITY): Payer: Medicare Other

## 2012-11-05 ENCOUNTER — Emergency Department (HOSPITAL_COMMUNITY)
Admission: EM | Admit: 2012-11-05 | Discharge: 2012-11-05 | Disposition: A | Discharge status: Home / Self Care | Payer: Medicare Other | Attending: Emergency Medicine | Admitting: Emergency Medicine

## 2012-11-05 ENCOUNTER — Encounter (HOSPITAL_COMMUNITY): Payer: Self-pay | Admitting: *Deleted

## 2012-11-05 DIAGNOSIS — Z79899 Other long term (current) drug therapy: Secondary | ICD-10-CM | POA: Insufficient documentation

## 2012-11-05 DIAGNOSIS — Z902 Acquired absence of lung [part of]: Secondary | ICD-10-CM | POA: Insufficient documentation

## 2012-11-05 DIAGNOSIS — R091 Pleurisy: Secondary | ICD-10-CM

## 2012-11-05 DIAGNOSIS — Z9889 Other specified postprocedural states: Secondary | ICD-10-CM | POA: Insufficient documentation

## 2012-11-05 DIAGNOSIS — R079 Chest pain, unspecified: Secondary | ICD-10-CM

## 2012-11-05 DIAGNOSIS — F17200 Nicotine dependence, unspecified, uncomplicated: Secondary | ICD-10-CM | POA: Insufficient documentation

## 2012-11-05 LAB — COMPREHENSIVE METABOLIC PANEL
BUN: 12 mg/dL (ref 6–23)
CO2: 26 mEq/L (ref 19–32)
Calcium: 9.2 mg/dL (ref 8.4–10.5)
Chloride: 100 mEq/L (ref 96–112)
Creatinine, Ser: 0.97 mg/dL (ref 0.50–1.35)
GFR calc Af Amer: >90 mL/min (ref >90)
GFR calc non Af Amer: >90 mL/min (ref >90)
Glucose, Bld: 108 mg/dL — ABNORMAL HIGH (ref 70–99)
Total Bilirubin: 0.3 mg/dL (ref 0.3–1.2)

## 2012-11-05 LAB — CBC WITH DIFFERENTIAL/PLATELET
Eosinophils Relative: 2 % (ref 0–5)
HCT: 37.2 % — ABNORMAL LOW (ref 39.0–52.0)
Hemoglobin: 13 g/dL (ref 13.0–17.0)
Lymphocytes Relative: 21 % (ref 12–46)
MCV: 84.2 fL (ref 78.0–100.0)
Monocytes Absolute: 0.7 10*3/uL (ref 0.1–1.0)
Monocytes Relative: 7 % (ref 3–12)
Neutro Abs: 7.4 10*3/uL (ref 1.7–7.7)
WBC: 10.7 10*3/uL — ABNORMAL HIGH (ref 4.0–10.5)

## 2012-11-05 LAB — TROPONIN I
Troponin I: <0.3 ng/mL (ref <0.30)
Troponin I: <0.3 ng/mL (ref <0.30)

## 2012-11-05 MED ORDER — KETOROLAC TROMETHAMINE 30 MG/ML IJ SOLN
30.0000 mg | Freq: Once | INTRAMUSCULAR | Status: AC
  Administered 2012-11-05: 30 mg via INTRAMUSCULAR
  Filled 2012-11-05: qty 1

## 2012-11-05 NOTE — ED Notes (Signed)
Pt c/o chest pain that started yesterday; feels like a tearing cutting pain; states feels like previous pain when he had pleurisy; midsternal; increased with deep breathing

## 2012-11-05 NOTE — ED Provider Notes (Signed)
History     CSN: [id number]  Arrival date & time 11/05/12  0042   First MD Initiated Contact with Patient 11/05/12 [phone]      Chief Complaint  Patient presents with  . Chest Pain    (Consider location/radiation/quality/duration/timing/severity/associated sxs/prior treatment) HPIJames D Little is a 56 y.o. male w/ PMH of pleurisy, last bout was 2 years ago in [place].  He says he feels like he has it again.  He h as a constant stabbing at both sides of his chest, worse when breathing. Smokes 1/2 PPD but has a 30PYH.  Has had chills and sweating with cough productive of clear to brownish sputum. Pain started Sunday afternoon, 8-9/10, no hemoptysis, no PE in past, no MI or CAD.   History reviewed. No pertinent past medical history.  Past Surgical History  Procedure Date  . Lung removal, partial   . Knee surgery   . Back surgery   . Hernia repair     No family history on file.  History  Substance Use Topics  . Smoking status: Current Every Day Smoker -- 1.0 packs/day    Types: Cigarettes  . Smokeless tobacco: Not on file  . Alcohol Use: No      Review of Systems At least 10pt or greater review of systems completed and are negative except where specified in the HPI.  Allergies  Dilaudid and Meperidine hcl  Home Medications   Current Outpatient Rx  Name  Route  Sig  Dispense  Refill  . AMPHETAMINE-DEXTROAMPHET ER 30 MG PO CP24   Oral   Take 60 mg by mouth 2 (two) times daily. Take 1 capsule in the morning and 1 capsule at noon         . CYCLOBENZAPRINE HCL 10 MG PO TABS   Oral   Take 10 mg by mouth 3 (three) times daily as needed. For muscle pain.         Marland Kitchen OMEGA-3 FATTY ACIDS 1000 MG PO CAPS   Oral   Take 1 g by mouth every morning.          . FUROSEMIDE 20 MG PO TABS   Oral   Take 20 mg by mouth every morning.         Marland Kitchen GABAPENTIN 300 MG PO CAPS   Oral   Take 300 mg by mouth 3 (three) times daily.         . ADULT MULTIVITAMIN W/MINERALS  CH   Oral   Take 1 tablet by mouth 2 (two) times daily.         Marland Kitchen OLANZAPINE 10 MG PO TABS   Oral   Take 10 mg by mouth at bedtime.         . OXYCODONE HCL 30 MG PO TABS   Oral   Take 30 mg by mouth every 6 (six) hours as needed. For pain.         . OXYCODONE HCL ER 60 MG PO TB12   Oral   Take 1 tablet by mouth 2 (two) times daily.         Marland Kitchen POTASSIUM CHLORIDE CRYS ER 20 MEQ PO TBCR   Oral   Take 20 mEq by mouth every morning.          . SUMATRIPTAN SUCCINATE 50 MG PO TABS   Oral   Take 50 mg by mouth every 2 (two) hours as needed. For migraines.         . TRAZODONE HCL  50 MG PO TABS   Oral   Take 50-100 mg by mouth at bedtime as needed. For sleep           BP 107/59  Pulse 67  Temp 98.2 F (36.8 C)  Resp 11  SpO2 99%  Physical Exam  Nursing notes reviewed.  Electronic medical record reviewed. VITAL SIGNS:   Filed Vitals:   11/05/12 0051 11/05/12 0303  BP: 122/89 107/59  Pulse: 101 67  Temp: 98.2 F (36.8 C)   Resp: 20 11  SpO2: 98% 99%   CONSTITUTIONAL: Awake, oriented, appears non-toxic HENT: Atraumatic, normocephalic, oral mucosa pink and moist, airway patent. Nares patent without drainage. External ears normal. EYES: Conjunctiva clear, EOMI, PERRLA NECK: Trachea midline, non-tender, supple CARDIOVASCULAR: Normal heart rate, Normal rhythm, No murmurs, rubs, gallops PULMONARY/CHEST: Clear to auscultation, no rhonchi, wheezes, or rales. Symmetrical breath sounds. Non-tender. ABDOMINAL: Non-distended, soft, non-tender - no rebound or guarding.  BS normal. NEUROLOGIC: Non-focal, moving all four extremities, no gross sensory or motor deficits. EXTREMITIES: No clubbing, cyanosis, or edema SKIN: Warm, Dry, No erythema, No rash  ED Course  Procedures (including critical care time)  Date: 11/05/2012  Rate: 99  Rhythm: normal sinus rhythm  QRS Axis: normal  Intervals: normal  ST/T Wave abnormalities: normal  Conduction Disutrbances: none   Narrative Interpretation: unremarkable     Labs Reviewed  CBC WITH DIFFERENTIAL - Abnormal; Notable for the following:    WBC 10.7 (*)     HCT 37.2 (*)     All other components within normal limits  COMPREHENSIVE METABOLIC PANEL - Abnormal; Notable for the following:    Glucose, Bld 108 (*)     All other components within normal limits  TROPONIN I   Dg Chest 2 View  11/05/2012  *RADIOLOGY REPORT*  Clinical Data: Chest pain and low grade fever.  CHEST - 2 VIEW  Comparison: Chest radiograph performed 01/28/2011  Findings: The lungs are well-aerated.  Mild chronic left basilar scarring is noted.  There is no evidence of focal opacification, pleural effusion or pneumothorax.  The heart is normal in size; the mediastinal contour is within normal limits.  No acute osseous abnormalities are seen.  IMPRESSION: No acute cardiopulmonary process seen.   Original Report Authenticated By: Tonia Ghent, M.D.      1. Pleurisy   2. Chest pain       MDM  [name] is a 56 y.o. male has pleuritic CP. PERC negative. CXR negative.  Labs unremarkable.  Trop x2 negative.  Likely pleurisy/viral syndrome.  No evidence for acute coronary syndrome, pericarditis, aortic dissection, pulmonary embolism, tension pneumothorax, pneumonia, and esophageal rupture.  I explained the diagnosis and have given explicit precautions to return to the ER for any other new or worsening symptoms. The patient understands and accepts the medical plan as it's been dictated and I have answered their questions. Discharge instructions concerning home care and prescriptions have been given.  The patient is STABLE and is discharged to home in good condition.         Jones Skene, MD 11/05/12 2340

## 2013-02-23 ENCOUNTER — Emergency Department (HOSPITAL_COMMUNITY): Payer: Medicare Other

## 2013-02-23 ENCOUNTER — Encounter (HOSPITAL_COMMUNITY): Payer: Self-pay

## 2013-02-23 ENCOUNTER — Emergency Department (HOSPITAL_COMMUNITY)
Admission: EM | Admit: 2013-02-23 | Discharge: 2013-02-23 | Disposition: A | Discharge status: Home / Self Care | Payer: Medicare Other | Attending: Emergency Medicine | Admitting: Emergency Medicine

## 2013-02-23 DIAGNOSIS — S40019A Contusion of unspecified shoulder, initial encounter: Secondary | ICD-10-CM | POA: Insufficient documentation

## 2013-02-23 DIAGNOSIS — Z8701 Personal history of pneumonia (recurrent): Secondary | ICD-10-CM | POA: Insufficient documentation

## 2013-02-23 DIAGNOSIS — S40011A Contusion of right shoulder, initial encounter: Secondary | ICD-10-CM

## 2013-02-23 DIAGNOSIS — W1809XA Striking against other object with subsequent fall, initial encounter: Secondary | ICD-10-CM | POA: Insufficient documentation

## 2013-02-23 DIAGNOSIS — F329 Major depressive disorder, single episode, unspecified: Secondary | ICD-10-CM | POA: Insufficient documentation

## 2013-02-23 DIAGNOSIS — Y929 Unspecified place or not applicable: Secondary | ICD-10-CM | POA: Insufficient documentation

## 2013-02-23 DIAGNOSIS — Z79899 Other long term (current) drug therapy: Secondary | ICD-10-CM | POA: Insufficient documentation

## 2013-02-23 DIAGNOSIS — F3289 Other specified depressive episodes: Secondary | ICD-10-CM | POA: Insufficient documentation

## 2013-02-23 DIAGNOSIS — M129 Arthropathy, unspecified: Secondary | ICD-10-CM | POA: Insufficient documentation

## 2013-02-23 DIAGNOSIS — Y9389 Activity, other specified: Secondary | ICD-10-CM | POA: Insufficient documentation

## 2013-02-23 DIAGNOSIS — F17200 Nicotine dependence, unspecified, uncomplicated: Secondary | ICD-10-CM | POA: Insufficient documentation

## 2013-02-23 DIAGNOSIS — F411 Generalized anxiety disorder: Secondary | ICD-10-CM | POA: Insufficient documentation

## 2013-02-23 HISTORY — DX: Unspecified osteoarthritis, unspecified site: M19.90

## 2013-02-23 HISTORY — DX: Major depressive disorder, single episode, unspecified: F32.9

## 2013-02-23 HISTORY — DX: Pneumonia, unspecified organism: J18.9

## 2013-02-23 HISTORY — DX: Depression, unspecified: F32.A

## 2013-02-23 HISTORY — DX: Anxiety disorder, unspecified: F41.9

## 2013-02-23 MED ORDER — KETOROLAC TROMETHAMINE 60 MG/2ML IM SOLN
60.0000 mg | Freq: Once | INTRAMUSCULAR | Status: AC
  Administered 2013-02-23: 60 mg via INTRAMUSCULAR
  Filled 2013-02-23: qty 2

## 2013-02-23 NOTE — ED Provider Notes (Signed)
History  This chart was scribed for non-physician practitioner Elpidio Anis, PA-C working with Doug Sou, MD, by Candelaria Stagers, ED Scribe. This patient was seen in room WTR6/WTR6 and the patient's care was started at 7:41 PM   CSN: [id number]  Arrival date & time 02/23/13  1858   First MD Initiated Contact with Patient 02/23/13 1911      Chief Complaint  Patient presents with  . Shoulder Injury    right x 1 day     The history is provided by the patient. No language interpreter was used.   [name] is a 57 y.o. male who presents to the Emergency Department complaining of right shoulder pain after he fell and landed on the right shoulder about three days ago.  Pt has taken oxycontin and gabapentin with no relief.  Pt takes oxycontin for chronic leg pain.  He has no other injuries.  He denies hitting his head or LOC.     Past Medical History  Diagnosis Date  . Pneumonia   . Arthritis   . Depression   . Anxiety     Past Surgical History  Procedure Laterality Date  . Lung removal, partial    . Knee surgery    . Back surgery    . Hernia repair      No family history on file.  History  Substance Use Topics  . Smoking status: Current Every Day Smoker -- 1.00 packs/day    Types: Cigarettes  . Smokeless tobacco: Not on file  . Alcohol Use: No      Review of Systems  Musculoskeletal: Positive for arthralgias (right shoulder pain).  All other systems reviewed and are negative.    Allergies  Dilaudid and Meperidine hcl  Home Medications   Current Outpatient Rx  Name  Route  Sig  Dispense  Refill  . amphetamine-dextroamphetamine (ADDERALL XR) 30 MG 24 hr capsule   Oral   Take 60 mg by mouth 2 (two) times daily. Take 1 capsule in the morning and 1 capsule at noon         . cyclobenzaprine (FLEXERIL) 10 MG tablet   Oral   Take 10 mg by mouth 3 (three) times daily as needed. For muscle pain.         . fish oil-omega-3 fatty acids 1000 MG  capsule   Oral   Take 1 g by mouth every morning.          . furosemide (LASIX) 20 MG tablet   Oral   Take 20 mg by mouth every morning.         . gabapentin (NEURONTIN) 300 MG capsule   Oral   Take 300 mg by mouth 3 (three) times daily.         . Multiple Vitamin (MULITIVITAMIN WITH MINERALS) TABS   Oral   Take 1 tablet by mouth 2 (two) times daily.         Marland Kitchen OLANZapine (ZYPREXA) 10 MG tablet   Oral   Take 10 mg by mouth at bedtime.         Marland Kitchen oxycodone (ROXICODONE) 30 MG immediate release tablet   Oral   Take 30 mg by mouth every 6 (six) hours as needed. For pain.         . Oxycodone HCl (OXYCONTIN) 60 MG TB12   Oral   Take 1 tablet by mouth 2 (two) times daily.         . potassium chloride SA (  K-DUR,KLOR-CON) 20 MEQ tablet   Oral   Take 20 mEq by mouth every morning.          . SUMAtriptan (IMITREX) 50 MG tablet   Oral   Take 50 mg by mouth every 2 (two) hours as needed. For migraines.         . traZODone (DESYREL) 50 MG tablet   Oral   Take 50-100 mg by mouth at bedtime as needed. For sleep         . vitamin E (VITAMIN E) 400 UNIT capsule   Oral   Take 400 Units by mouth daily.           BP 102/58  Pulse 115  Temp(Src) 98.8 F (37.1 C) (Oral)  Resp 16  Ht 5\' 8"  (1.727 m)  Wt 215 lb (97.523 kg)  BMI 32.7 kg/m2  SpO2 98%  Physical Exam  Nursing note and vitals reviewed. Constitutional: He is oriented to person, place, and time. He appears well-developed and well-nourished. No distress.  HENT:  Head: Normocephalic and atraumatic.  Eyes: EOM are normal.  Neck: Neck supple. No tracheal deviation present.  Cardiovascular: Normal rate.   Pulmonary/Chest: Effort normal. No respiratory distress.  Musculoskeletal: Normal range of motion.  Right shoulder with no AC drop off. No bony deformity.  No strength deficits.  Distal pulses intact.  No clavicular pain, no spinal tenderness.   Neurological: He is alert and oriented to person,  place, and time.  Skin: Skin is warm and dry.  Psychiatric: He has a normal mood and affect. His behavior is normal.    ED Course  Procedures   DIAGNOSTIC STUDIES: Oxygen Saturation is 98% on room air, normal by my interpretation.    COORDINATION OF CARE:  7:43 PM Discussed course of care with pt which includes images of right shoulder and pain medication.  Pt understands and agrees.    Labs Reviewed - No data to display Dg Shoulder Right  02/23/2013  *RADIOLOGY REPORT*  Clinical Data: 57 year old male status post fall.  Pain.  RIGHT SHOULDER - 2+ VIEW  Comparison: None.  Findings: No glenohumeral joint dislocation.  Proximal right humerus intact.  Right clavicle intact.  Degenerative changes at the right [facility] joint.  Visible right ribs and lung parenchyma within normal limits.  No acute fracture identified.  IMPRESSION: A pass fracture right shoulder   Original Report Authenticated By: Erskine Speed, M.D.      No diagnosis found.  1. Right shoulder contusion   MDM  No acute fracture to shoulder. Patient already on regular high dose narcotic pain reliever. Recommended anti-inflammatories and cool compresses. Ortho referral provided.  I personally performed the services described in this documentation, which was scribed in my presence. The recorded information has been reviewed and is accurate.         Arnoldo Hooker, PA-C 02/23/13 2045

## 2013-02-23 NOTE — ED Notes (Signed)
Pt presents with  NAD- pt reports rt knee gave out and pt used rt shoulder to brace himself- pt c/o rt shoulder pain since.  Pt took "regular meds' oxycontin and gaba' without relief"

## 2013-02-24 NOTE — ED Provider Notes (Signed)
Medical screening examination/treatment/procedure(s) were performed by non-physician practitioner and as supervising physician I was immediately available for consultation/collaboration.  Weda Baumgarner, MD 02/24/13 0018 

## 2013-05-26 ENCOUNTER — Emergency Department (HOSPITAL_COMMUNITY)
Admission: EM | Admit: 2013-05-26 | Discharge: 2013-05-26 | Disposition: A | Discharge status: Home / Self Care | Payer: Non-veteran care | Attending: Emergency Medicine | Admitting: Emergency Medicine

## 2013-05-26 ENCOUNTER — Emergency Department (HOSPITAL_COMMUNITY): Payer: Non-veteran care

## 2013-05-26 ENCOUNTER — Encounter (HOSPITAL_COMMUNITY): Payer: Self-pay | Admitting: *Deleted

## 2013-05-26 DIAGNOSIS — Y929 Unspecified place or not applicable: Secondary | ICD-10-CM | POA: Insufficient documentation

## 2013-05-26 DIAGNOSIS — Z79899 Other long term (current) drug therapy: Secondary | ICD-10-CM | POA: Insufficient documentation

## 2013-05-26 DIAGNOSIS — T24239A Burn of second degree of unspecified lower leg, initial encounter: Secondary | ICD-10-CM | POA: Insufficient documentation

## 2013-05-26 DIAGNOSIS — S4991XA Unspecified injury of right shoulder and upper arm, initial encounter: Secondary | ICD-10-CM

## 2013-05-26 DIAGNOSIS — F411 Generalized anxiety disorder: Secondary | ICD-10-CM | POA: Insufficient documentation

## 2013-05-26 DIAGNOSIS — T310 Burns involving less than 10% of body surface: Secondary | ICD-10-CM | POA: Insufficient documentation

## 2013-05-26 DIAGNOSIS — S4980XA Other specified injuries of shoulder and upper arm, unspecified arm, initial encounter: Secondary | ICD-10-CM | POA: Insufficient documentation

## 2013-05-26 DIAGNOSIS — F329 Major depressive disorder, single episode, unspecified: Secondary | ICD-10-CM | POA: Insufficient documentation

## 2013-05-26 DIAGNOSIS — IMO0002 Reserved for concepts with insufficient information to code with codable children: Secondary | ICD-10-CM

## 2013-05-26 DIAGNOSIS — Y9389 Activity, other specified: Secondary | ICD-10-CM | POA: Insufficient documentation

## 2013-05-26 DIAGNOSIS — Z8701 Personal history of pneumonia (recurrent): Secondary | ICD-10-CM | POA: Insufficient documentation

## 2013-05-26 DIAGNOSIS — F3289 Other specified depressive episodes: Secondary | ICD-10-CM | POA: Insufficient documentation

## 2013-05-26 DIAGNOSIS — X020XXA Exposure to flames in controlled fire in building or structure, initial encounter: Secondary | ICD-10-CM | POA: Insufficient documentation

## 2013-05-26 DIAGNOSIS — S46909A Unspecified injury of unspecified muscle, fascia and tendon at shoulder and upper arm level, unspecified arm, initial encounter: Secondary | ICD-10-CM | POA: Insufficient documentation

## 2013-05-26 DIAGNOSIS — F17200 Nicotine dependence, unspecified, uncomplicated: Secondary | ICD-10-CM | POA: Insufficient documentation

## 2013-05-26 DIAGNOSIS — M129 Arthropathy, unspecified: Secondary | ICD-10-CM | POA: Insufficient documentation

## 2013-05-26 DIAGNOSIS — R296 Repeated falls: Secondary | ICD-10-CM | POA: Insufficient documentation

## 2013-05-26 MED ORDER — HYDROCODONE-ACETAMINOPHEN 5-325 MG PO TABS: 1.0000 | ORAL_TABLET | Freq: Four times a day (QID) | ORAL | Status: DC | PRN

## 2013-05-26 MED ORDER — PREDNISONE 50 MG PO TABS: 50.0000 mg | ORAL_TABLET | Freq: Every day | ORAL | Status: DC

## 2013-05-26 MED ORDER — SILVER SULFADIAZINE 1 % EX CREA
TOPICAL_CREAM | Freq: Once | CUTANEOUS | Status: AC
  Administered 2013-05-26: 14:00:00 via TOPICAL
  Filled 2013-05-26: qty 50
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
## 2013-05-28 NOTE — ED Provider Notes (Signed)
Medical screening examination/treatment/procedure(s) were performed by non-physician practitioner and as supervising physician I was immediately available for consultation/collaboration.   Gilda Crease, MD 05/28/13 [phone]

## 2013-08-14 ENCOUNTER — Observation Stay (HOSPITAL_COMMUNITY)
Admission: EM | Admit: 2013-08-14 | Discharge: 2013-08-16 | Disposition: A | Discharge status: Home / Self Care | Payer: Medicare Other | Attending: Cardiovascular Disease | Admitting: Cardiovascular Disease

## 2013-08-14 ENCOUNTER — Emergency Department (HOSPITAL_COMMUNITY): Payer: Medicare Other

## 2013-08-14 ENCOUNTER — Encounter (HOSPITAL_COMMUNITY): Payer: Self-pay | Admitting: Emergency Medicine

## 2013-08-14 DIAGNOSIS — M1990 Unspecified osteoarthritis, unspecified site: Secondary | ICD-10-CM | POA: Insufficient documentation

## 2013-08-14 DIAGNOSIS — Z79899 Other long term (current) drug therapy: Secondary | ICD-10-CM | POA: Insufficient documentation

## 2013-08-14 DIAGNOSIS — K7689 Other specified diseases of liver: Secondary | ICD-10-CM | POA: Insufficient documentation

## 2013-08-14 DIAGNOSIS — J869 Pyothorax without fistula: Secondary | ICD-10-CM | POA: Diagnosis present

## 2013-08-14 DIAGNOSIS — R079 Chest pain, unspecified: Principal | ICD-10-CM

## 2013-08-14 DIAGNOSIS — F411 Generalized anxiety disorder: Secondary | ICD-10-CM | POA: Insufficient documentation

## 2013-08-14 DIAGNOSIS — S0501XA Injury of conjunctiva and corneal abrasion without foreign body, right eye, initial encounter: Secondary | ICD-10-CM

## 2013-08-14 DIAGNOSIS — I309 Acute pericarditis, unspecified: Secondary | ICD-10-CM | POA: Diagnosis present

## 2013-08-14 DIAGNOSIS — F419 Anxiety disorder, unspecified: Secondary | ICD-10-CM | POA: Diagnosis present

## 2013-08-14 DIAGNOSIS — Z902 Acquired absence of lung [part of]: Secondary | ICD-10-CM | POA: Insufficient documentation

## 2013-08-14 DIAGNOSIS — F17200 Nicotine dependence, unspecified, uncomplicated: Secondary | ICD-10-CM | POA: Insufficient documentation

## 2013-08-14 DIAGNOSIS — X58XXXA Exposure to other specified factors, initial encounter: Secondary | ICD-10-CM | POA: Insufficient documentation

## 2013-08-14 DIAGNOSIS — S058X9A Other injuries of unspecified eye and orbit, initial encounter: Secondary | ICD-10-CM | POA: Insufficient documentation

## 2013-08-14 LAB — BASIC METABOLIC PANEL
BUN: 22 mg/dL (ref 6–23)
CO2: 31 mEq/L (ref 19–32)
Calcium: 9.4 mg/dL (ref 8.4–10.5)
Creatinine, Ser: 1.27 mg/dL (ref 0.50–1.35)
GFR calc Af Amer: 72 mL/min — ABNORMAL LOW (ref >90)
GFR calc non Af Amer: 62 mL/min — ABNORMAL LOW (ref >90)
Sodium: 140 mEq/L (ref 135–145)

## 2013-08-14 LAB — CBC
Hemoglobin: 14.4 g/dL (ref 13.0–17.0)
MCH: 29.3 pg (ref 26.0–34.0)
MCHC: 34.2 g/dL (ref 30.0–36.0)
MCV: 85.7 fL (ref 78.0–100.0)
Platelets: 254 10*3/uL (ref 150–400)
RBC: 4.91 MIL/uL (ref 4.22–5.81)
RDW: 13.7 % (ref 11.5–15.5)

## 2013-08-14 LAB — POCT I-STAT TROPONIN I: Troponin i, poc: 0 ng/mL (ref 0.00–0.08)

## 2013-08-14 LAB — TROPONIN I: Troponin I: <0.3 ng/mL (ref <0.30)

## 2013-08-14 MED ORDER — GABAPENTIN 400 MG PO CAPS
1200.0000 mg | ORAL_CAPSULE | Freq: Three times a day (TID) | ORAL | Status: DC
  Administered 2013-08-14 – 2013-08-16 (×5): 1200 mg via ORAL
  Filled 2013-08-14 (×7): qty 3

## 2013-08-14 MED ORDER — PANTOPRAZOLE SODIUM 40 MG PO TBEC
40.0000 mg | DELAYED_RELEASE_TABLET | Freq: Every day | ORAL | Status: DC
  Administered 2013-08-15 – 2013-08-16 (×2): 40 mg via ORAL
  Filled 2013-08-14 (×2): qty 1

## 2013-08-14 MED ORDER — KETOROLAC TROMETHAMINE 30 MG/ML IJ SOLN
30.0000 mg | Freq: Once | INTRAMUSCULAR | Status: AC
  Administered 2013-08-14: 30 mg via INTRAVENOUS
  Filled 2013-08-14 (×2): qty 1

## 2013-08-14 MED ORDER — NAPROXEN 500 MG PO TABS
500.0000 mg | ORAL_TABLET | Freq: Two times a day (BID) | ORAL | Status: DC
  Administered 2013-08-15 – 2013-08-16 (×3): 500 mg via ORAL
  Filled 2013-08-14 (×5): qty 1

## 2013-08-14 MED ORDER — AMPHETAMINE-DEXTROAMPHET ER 10 MG PO CP24
30.0000 mg | ORAL_CAPSULE | Freq: Two times a day (BID) | ORAL | Status: DC
  Administered 2013-08-15 – 2013-08-16 (×3): 30 mg via ORAL
  Filled 2013-08-14: qty 1
  Filled 2013-08-14 (×2): qty 3

## 2013-08-14 MED ORDER — HYDROMORPHONE HCL PF 1 MG/ML IJ SOLN
1.0000 mg | Freq: Once | INTRAMUSCULAR | Status: AC
  Administered 2013-08-14: 1 mg via INTRAVENOUS

## 2013-08-14 MED ORDER — GI COCKTAIL ~~LOC~~
30.0000 mL | Freq: Once | ORAL | Status: AC
  Administered 2013-08-14: 30 mL via ORAL
  Filled 2013-08-14: qty 30

## 2013-08-14 MED ORDER — NITROGLYCERIN 0.4 MG SL SUBL: 0.4000 mg | SUBLINGUAL_TABLET | SUBLINGUAL | Status: DC | PRN

## 2013-08-14 MED ORDER — NITROGLYCERIN IN D5W 200-5 MCG/ML-% IV SOLN
10.0000 ug/min | INTRAVENOUS | Status: DC
  Administered 2013-08-14: 16.667 ug/min via INTRAVENOUS
  Filled 2013-08-14: qty 250

## 2013-08-14 MED ORDER — NITROGLYCERIN 0.4 MG SL SUBL
SUBLINGUAL_TABLET | SUBLINGUAL | Status: AC
  Administered 2013-08-14: 16:00:00
  Filled 2013-08-14: qty 25

## 2013-08-14 MED ORDER — OMEGA-3 FATTY ACIDS 1000 MG PO CAPS: 1.0000 g | ORAL_CAPSULE | Freq: Every morning | ORAL | Status: DC

## 2013-08-14 MED ORDER — CYCLOBENZAPRINE HCL 10 MG PO TABS
10.0000 mg | ORAL_TABLET | Freq: Three times a day (TID) | ORAL | Status: DC | PRN
  Administered 2013-08-14 – 2013-08-16 (×3): 10 mg via ORAL
  Filled 2013-08-14 (×4): qty 1

## 2013-08-14 MED ORDER — OXYCODONE HCL 5 MG PO TABS
30.0000 mg | ORAL_TABLET | Freq: Four times a day (QID) | ORAL | Status: DC | PRN
  Administered 2013-08-14 – 2013-08-16 (×5): 30 mg via ORAL
  Filled 2013-08-14 (×5): qty 6

## 2013-08-14 MED ORDER — HYDROMORPHONE HCL PF 1 MG/ML IJ SOLN
1.0000 mg | Freq: Once | INTRAMUSCULAR | Status: DC
  Filled 2013-08-14: qty 1

## 2013-08-14 MED ORDER — POTASSIUM CHLORIDE CRYS ER 20 MEQ PO TBCR
20.0000 meq | EXTENDED_RELEASE_TABLET | Freq: Every morning | ORAL | Status: DC
  Administered 2013-08-15 – 2013-08-16 (×2): 20 meq via ORAL
  Filled 2013-08-14 (×2): qty 1

## 2013-08-14 MED ORDER — SODIUM CHLORIDE 0.9 % IV BOLUS (SEPSIS)
500.0000 mL | Freq: Once | INTRAVENOUS | Status: AC
  Administered 2013-08-14: 1000 mL via INTRAVENOUS

## 2013-08-14 MED ORDER — ADULT MULTIVITAMIN W/MINERALS CH
1.0000 | ORAL_TABLET | Freq: Two times a day (BID) | ORAL | Status: DC
  Administered 2013-08-14 – 2013-08-16 (×4): 1 via ORAL
  Filled 2013-08-14 (×6): qty 1

## 2013-08-14 MED ORDER — TETRACAINE HCL 0.5 % OP SOLN
1.0000 [drp] | Freq: Once | OPHTHALMIC | Status: AC
  Administered 2013-08-14: 1 [drp] via OPHTHALMIC
  Filled 2013-08-14: qty 2

## 2013-08-14 MED ORDER — OXYCODONE HCL 5 MG PO TABS: 30.0000 mg | ORAL_TABLET | Freq: Four times a day (QID) | ORAL | Status: DC | PRN

## 2013-08-14 MED ORDER — NITROGLYCERIN 0.3 MG SL SUBL
0.3000 mg | SUBLINGUAL_TABLET | SUBLINGUAL | Status: DC | PRN
  Filled 2013-08-14: qty 100

## 2013-08-14 MED ORDER — OMEGA-3-ACID ETHYL ESTERS 1 G PO CAPS
1.0000 g | ORAL_CAPSULE | Freq: Every day | ORAL | Status: DC
  Administered 2013-08-15 – 2013-08-16 (×2): 1 g via ORAL
  Filled 2013-08-14 (×2): qty 1

## 2013-08-14 MED ORDER — FLUORESCEIN SODIUM 1 MG OP STRP
1.0000 | ORAL_STRIP | Freq: Once | OPHTHALMIC | Status: AC
  Administered 2013-08-14: 1 via OPHTHALMIC
  Filled 2013-08-14: qty 1

## 2013-08-14 MED ORDER — TRAZODONE HCL 100 MG PO TABS
100.0000 mg | ORAL_TABLET | Freq: Every evening | ORAL | Status: DC | PRN
  Administered 2013-08-14 – 2013-08-16 (×2): 100 mg via ORAL
  Filled 2013-08-14 (×3): qty 1

## 2013-08-14 MED ORDER — HYDROMORPHONE HCL PF 1 MG/ML IJ SOLN
1.0000 mg | INTRAMUSCULAR | Status: DC | PRN
  Administered 2013-08-14 – 2013-08-16 (×6): 1 mg via INTRAVENOUS
  Filled 2013-08-14 (×6): qty 1

## 2013-08-14 MED ORDER — HYDROMORPHONE HCL PF 1 MG/ML IJ SOLN
INTRAMUSCULAR | Status: AC
  Administered 2013-08-14: 1 mg
  Filled 2013-08-14: qty 1

## 2013-08-14 MED ORDER — ONDANSETRON HCL 4 MG/2ML IJ SOLN: 4.0000 mg | Freq: Four times a day (QID) | INTRAMUSCULAR | Status: DC | PRN

## 2013-08-14 NOTE — Progress Notes (Signed)
Called ED and attempted to get report from RN. Was informed that pt was not ready to come to floor. Will pass on to night RN.
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
## 2013-08-14 NOTE — Progress Notes (Signed)
Patient confrims his pcp is Dr. Joseph Art at the [facility] out patient clinic in [place].  System updated.

## 2013-08-14 NOTE — ED Provider Notes (Signed)
CSN: [id number]     Arrival date & time 08/14/13  1546 History   First MD Initiated Contact with Patient 08/14/13 1548     Chief Complaint  Patient presents with  . Chest Pain   (Consider location/radiation/quality/duration/timing/severity/associated sxs/prior Treatment) Patient is a 57 y.o. male presenting with chest pain. The history is provided by the patient.  Chest Pain Pain location:  R chest and L chest Pain quality comment:  Squeezing Pain radiates to:  R shoulder Pain radiates to the back: no   Pain severity:  Severe Onset quality:  Sudden Duration:  40 minutes Timing:  Constant Progression:  Unchanged Chronicity:  New Context: at rest   Relieved by:  Nothing Worsened by:  Nothing tried Ineffective treatments:  None tried Associated symptoms: shortness of breath (mild)   Associated symptoms: no abdominal pain, no cough, no fever, no headache, no nausea, no numbness and not vomiting     Past Medical History  Diagnosis Date  . Pneumonia   . Arthritis   . Depression   . Anxiety    Past Surgical History  Procedure Laterality Date  . Lung removal, partial    . Knee surgery    . Back surgery    . Hernia repair     History reviewed. No pertinent family history. History  Substance Use Topics  . Smoking status: Current Every Day Smoker -- 1.00 packs/day    Types: Cigarettes  . Smokeless tobacco: Not on file  . Alcohol Use: No    Review of Systems  Constitutional: Negative for fever.  HENT: Negative for drooling and rhinorrhea.   Eyes: Negative for pain.  Respiratory: Positive for shortness of breath (mild). Negative for cough.   Cardiovascular: Positive for chest pain. Negative for leg swelling.  Gastrointestinal: Negative for nausea, vomiting, abdominal pain and diarrhea.  Genitourinary: Negative for dysuria and hematuria.  Musculoskeletal: Negative for gait problem and neck pain.  Skin: Negative for color change.  Neurological: Negative for numbness and  headaches.  Hematological: Negative for adenopathy.  Psychiatric/Behavioral: Negative for behavioral problems.  All other systems reviewed and are negative.    Allergies  Dilaudid and Meperidine hcl  Home Medications   Current Outpatient Rx  Name  Route  Sig  Dispense  Refill  . amphetamine-dextroamphetamine (ADDERALL XR) 30 MG 24 hr capsule   Oral   Take 30 mg by mouth 2 (two) times daily. Take 1 capsule in the morning and 1 capsule at noon         . cyclobenzaprine (FLEXERIL) 10 MG tablet   Oral   Take 10 mg by mouth 3 (three) times daily as needed. For muscle pain.         . fish oil-omega-3 fatty acids 1000 MG capsule   Oral   Take 1 g by mouth every morning.          . gabapentin (NEURONTIN) 300 MG capsule   Oral   Take 1,200 mg by mouth 3 (three) times daily.          Marland Kitchen HYDROcodone-acetaminophen (NORCO/VICODIN) 5-325 MG per tablet   Oral   Take 1 tablet by mouth every 6 (six) hours as needed for pain.   15 tablet   0   . Multiple Vitamin (MULITIVITAMIN WITH MINERALS) TABS   Oral   Take 1 tablet by mouth 2 (two) times daily.         Marland Kitchen OLANZapine (ZYPREXA) 10 MG tablet   Oral   Take 10  mg by mouth at bedtime.         Marland Kitchen oxycodone (ROXICODONE) 30 MG immediate release tablet   Oral   Take 30 mg by mouth every 6 (six) hours as needed. For pain.         . Oxycodone HCl (OXYCONTIN) 60 MG TB12   Oral   Take 1 tablet by mouth 2 (two) times daily.         . potassium chloride SA (K-DUR,KLOR-CON) 20 MEQ tablet   Oral   Take 20 mEq by mouth every morning.          . predniSONE (DELTASONE) 50 MG tablet   Oral   Take 1 tablet (50 mg total) by mouth daily.   5 tablet   0   . SUMAtriptan (IMITREX) 50 MG tablet   Oral   Take 50 mg by mouth every 2 (two) hours as needed. For migraines.         . traZODone (DESYREL) 50 MG tablet   Oral   Take 50-100 mg by mouth at bedtime as needed. For sleep         . vitamin E (VITAMIN E) 400 UNIT  capsule   Oral   Take 400 Units by mouth daily.          BP 157/99  Pulse 113  Temp(Src) 98.7 F (37.1 C) (Oral)  Resp 18  SpO2 100% Physical Exam  Nursing note and vitals reviewed. Constitutional: He is oriented to person, place, and time. He appears well-developed and well-nourished. He appears distressed (pt is writhing on bed in pain).  Mildly diaphoretic  HENT:  Head: Normocephalic and atraumatic.  Right Ear: External ear normal.  Left Ear: External ear normal.  Nose: Nose normal.  Mouth/Throat: Oropharynx is clear and moist. No oropharyngeal exudate.  Eyes: Conjunctivae and EOM are normal. Pupils are equal, round, and reactive to light.  Neck: Normal range of motion. Neck supple.  Cardiovascular: Normal rate, regular rhythm, normal heart sounds and intact distal pulses.  Exam reveals no gallop and no friction rub.   No murmur heard. Pulmonary/Chest: Effort normal and breath sounds normal. No respiratory distress. He has no wheezes.  Abdominal: Soft. Bowel sounds are normal. He exhibits no distension. There is no tenderness. There is no rebound and no guarding.  Musculoskeletal: Normal range of motion. He exhibits no edema and no tenderness.  Neurological: He is alert and oriented to person, place, and time.  Skin: Skin is warm and dry.  Psychiatric: He has a normal mood and affect. His behavior is normal.    ED Course  Procedures (including critical care time) Labs Review Labs Reviewed  CBC - Abnormal; Notable for the following:    WBC 11.8 (*)    All other components within normal limits  BASIC METABOLIC PANEL - Abnormal; Notable for the following:    GFR calc non Af Amer 62 (*)    GFR calc Af Amer 72 (*)    All other components within normal limits  TROPONIN I  TROPONIN I  TROPONIN I  POCT I-STAT TROPONIN I   Imaging Review Dg Chest Port 1 View  08/14/2013   CLINICAL DATA:  Chest pain and weakness.  EXAM: PORTABLE CHEST - 1 VIEW  COMPARISON:  Wedge  resection clips noted at the left lung base. Subsegmental atelectasis noted at both lung bases. Low lung volumes are present, causing crowding of the pulmonary vasculature.  Cardiac and mediastinal margins appear normal. Thoracic spondylosis noted.  No pleural effusion identified.  IMPRESSION: 1.  Postoperative findings at the left lung base. 2. Low lung volumes with bibasilar subsegmental atelectasis. 3. Thoracic spondylosis.   Electronically Signed   By: Herbie Baltimore M.D.   On: 08/14/2013 16:35     Date: 08/14/2013  Rate: 114  Rhythm: sinus tachycardia  QRS Axis: normal  Intervals: normal  ST/T Wave abnormalities: normal  Conduction Disutrbances:none  Narrative Interpretation: mild artifact at baseline, no obvious new ST or T wave changes cw ischemia  Old EKG Reviewed: unchanged   MDM   1. Chest pain with moderate risk of acute coronary syndrome   2. Pericarditis, acute   3. Injury of conjunctiva and corneal abrasion of right eye w/o FB, initial encounter    4:00 PM 57 y.o. male who presents with chest squeezing which began proximally 40 minutes ago. The patient is writhing in pain on exam. He notes his pain has been constant since then and it started while he was at rest. He is afebrile and slightly tachycardic here. Blood pressure stable. He is a smoker, but denies other RF's for heart dis. Does have hx of partial lung resection. Ecg is not readily available to me so I will get this now. Will get labs, nitroglycerin, pain control w/ dilaudid. Pt took 325mg  asa pta at home.   Consulted cards d/t severe chest pain who will admit.   Will recommend erythromycin ointment to right eye qid for 7 days.    Junius Argyle, MD 08/15/13 [phone]

## 2013-08-14 NOTE — ED Notes (Signed)
Pt c/o central chest pain, SOB, nausea, dizziness, weakness and back pain since 1500.  Pain score 10/10.  Hx of chronic cramping and chronic back pain.

## 2013-08-15 ENCOUNTER — Encounter (HOSPITAL_COMMUNITY): Payer: Self-pay | Admitting: Radiology

## 2013-08-15 ENCOUNTER — Observation Stay (HOSPITAL_COMMUNITY): Payer: Medicare Other

## 2013-08-15 DIAGNOSIS — I517 Cardiomegaly: Secondary | ICD-10-CM

## 2013-08-15 LAB — APTT: aPTT: 27 seconds (ref 24–37)

## 2013-08-15 LAB — D-DIMER, QUANTITATIVE: D-Dimer, Quant: 2.52 ug/mL-FEU — ABNORMAL HIGH (ref 0.00–0.48)

## 2013-08-15 LAB — TROPONIN I
Troponin I: <0.3 ng/mL (ref <0.30)
Troponin I: <0.3 ng/mL (ref <0.30)

## 2013-08-15 LAB — PROTIME-INR: Prothrombin Time: 11.9 seconds (ref 11.6–15.2)

## 2013-08-15 MED ORDER — KETOROLAC TROMETHAMINE 30 MG/ML IJ SOLN
30.0000 mg | Freq: Four times a day (QID) | INTRAMUSCULAR | Status: DC | PRN
  Administered 2013-08-15 (×2): 30 mg via INTRAVENOUS
  Filled 2013-08-15 (×2): qty 1

## 2013-08-15 MED ORDER — HEPARIN BOLUS VIA INFUSION
2500.0000 [IU] | Freq: Once | INTRAVENOUS | Status: DC
  Filled 2013-08-15: qty 2500

## 2013-08-15 MED ORDER — HEPARIN (PORCINE) IN NACL 100-0.45 UNIT/ML-% IJ SOLN
1500.0000 [IU]/h | INTRAMUSCULAR | Status: DC
  Filled 2013-08-15: qty 250

## 2013-08-15 MED ORDER — IOHEXOL 350 MG/ML SOLN
100.0000 mL | Freq: Once | INTRAVENOUS | Status: AC | PRN
  Administered 2013-08-15: 100 mL via INTRAVENOUS

## 2013-08-15 NOTE — Progress Notes (Signed)
Assumed care of patient from off going RN. No Changes in initial shift assessment. Cont with plan of care. 

## 2013-08-15 NOTE — Progress Notes (Signed)
Subjective: Chest pain has improved to 7/10.  Still worse with inspiration  Objective: Vital signs in last 24 hours: Temp:  [97.9 F (36.6 C)-98.7 F (37.1 C)] 97.9 F (36.6 C) (10/09 0453) Pulse Rate:  [75-113] 75 (10/09 0453) Resp:  [13-22] 16 (10/09 0453) BP: (108-157)/(72-99) 108/97 mmHg (10/09 0453) SpO2:  [97 %-100 %] 98 % (10/09 0453) Weight:  [207 lb 12.8 oz (94.257 kg)] 207 lb 12.8 oz (94.257 kg) (10/08 2059) Last BM Date: 08/14/13  Intake/Output from previous day:   Intake/Output this shift:    Medications Current Facility-Administered Medications  Medication Dose Route Frequency Provider Last Rate Last Dose  . amphetamine-dextroamphetamine (ADDERALL XR) 24 hr capsule 30 mg  30 mg Oral BID WC Eda Paschal Kilroy, PA-C   30 mg at 08/15/13 0813  . cyclobenzaprine (FLEXERIL) tablet 10 mg  10 mg Oral TID PRN Abelino Derrick, PA-C   10 mg at 08/14/13 2302  . gabapentin (NEURONTIN) capsule 1,200 mg  1,200 mg Oral TID Abelino Derrick, PA-C   1,200 mg at 08/14/13 2301  . HYDROmorphone (DILAUDID) injection 1 mg  1 mg Intravenous Q4H PRN Abelino Derrick, PA-C   1 mg at 08/15/13 0458  . multivitamin with minerals tablet 1 tablet  1 tablet Oral BID Abelino Derrick, PA-C   1 tablet at 08/14/13 2301  . naproxen (NAPROSYN) tablet 500 mg  500 mg Oral BID WC Eda Paschal Kilroy, PA-C   500 mg at 08/15/13 0751  . nitroGLYCERIN (NITROSTAT) SL tablet 0.4 mg  0.4 mg Sublingual Q5 min PRN Junius Argyle, MD      . nitroGLYCERIN 0.2 mg/mL in dextrose 5 % infusion  10-200 mcg/min Intravenous Titrated Junius Argyle, MD   [phone] mcg/min at 08/14/13 1723  . omega-3 acid ethyl esters (LOVAZA) capsule 1 g  1 g Oral Daily Mihai Croitoru, MD      . ondansetron (ZOFRAN) injection 4 mg  4 mg Intravenous Q6H PRN Abelino Derrick, PA-C      . oxyCODONE (Oxy IR/ROXICODONE) immediate release tablet 30 mg  30 mg Oral Q6H PRN Abelino Derrick, PA-C   30 mg at 08/15/13 0751  . pantoprazole (PROTONIX) EC tablet 40 mg  40  mg Oral Q0600 Abelino Derrick, PA-C   40 mg at 08/15/13 0503  . potassium chloride SA (K-DUR,KLOR-CON) CR tablet 20 mEq  20 mEq Oral q morning - 10a Luke K Kilroy, PA-C      . traZODone (DESYREL) tablet 100 mg  100 mg Oral QHS PRN Abelino Derrick, PA-C   100 mg at 08/14/13 2302    PE: General appearance: alert, cooperative and no distress Lungs: clear to auscultation bilaterally Heart: regular rate and rhythm, S1, S2 normal, no murmur, click, rub or gallop Extremities: No LEE Pulses: 2+ and symmetric Skin: Warm and dry Neurologic: Grossly normal  Lab Results:   Recent Labs  08/14/13 1600  WBC 11.8*  HGB 14.4  HCT 42.1  PLT 254   BMET  Recent Labs  08/14/13 1600  NA 140  K 3.7  CL 98  CO2 31  GLUCOSE 86  BUN 22  CREATININE 1.27  CALCIUM 9.4   Lipid Panel  No results found for this basename: chol, trig, hdl, cholhdl, vldl, ldlcalc    Cardiac Panel (last 3 results)  Recent Labs  08/14/13 1810 08/14/13 2357 08/15/13 0551  TROPONINI <0.30 <0.30 <0.30    Assessment/Plan  Principal Problem:  Chest pain with moderate risk of acute coronary syndrom vs pericarditis Active Problems:   TOBACCO ABUSE   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  SP Toradol x1 one.  Will add PRN Q6hr.  BP and HR stable.    2D echo pending.  Possible DC later today with course of NSAIDs.   LOS: 1 day    HAGER, BRYAN 08/15/2013 8:37 AM   Agree with note written by Jones Skene PAC  CP better. 2D nl. Enz neg. Other labs OK. Exam notable for a loud P2. Suspect pericarditis/pleuricy but will get a D-Dimer to eval for PE. If nl can be D/Cd home on NSAIDs and get OP myoview to R/O ischemia (he is a smoker).   Runell Gess 08/15/2013 4:17 PM

## 2013-08-15 NOTE — Progress Notes (Signed)
CT of chest was negative for PE. Dr. Herbie Baltimore was notified of the result. Order was made to discontinue Heparin.  Allayne Butcher, PA-C

## 2013-08-15 NOTE — Progress Notes (Signed)
PHARMACY BRIEF NOTE -- Anticoagulation  A STAT "heparin per pharmacy consult" order was issued today at 17:00 because a pulmonary embolism was suspected.    Baseline labs -- PT/INR and aPTT -- were ordered and drawn.  The results were within the normal ranges.  Orders for IV heparin were also placed on the patient's medication profile, and an IV bag of heparin was sent to the nursing unit.  The patient's nurse was instructed to wait for the CT results before starting the heparin infusion.    The CT angiography results stated "no evidence of a pulmonary embolus," and the order for heparin was discontinued.  Polo Riley R.Ph. 08/15/2013 9:15 PM

## 2013-08-16 ENCOUNTER — Telehealth (HOSPITAL_COMMUNITY): Payer: Self-pay | Admitting: *Deleted

## 2013-08-16 ENCOUNTER — Other Ambulatory Visit: Payer: Self-pay | Admitting: Cardiology

## 2013-08-16 DIAGNOSIS — R079 Chest pain, unspecified: Secondary | ICD-10-CM

## 2013-08-16 MED ORDER — OMEGA-3-ACID ETHYL ESTERS 1 G PO CAPS: 1.0000 g | ORAL_CAPSULE | Freq: Every day | ORAL | Status: DC

## 2013-08-16 MED ORDER — NAPROXEN 500 MG PO TABS: 500.0000 mg | ORAL_TABLET | Freq: Two times a day (BID) | ORAL | Status: DC

## 2013-08-16 NOTE — Discharge Summary (Signed)
Physician Discharge Summary  Patient ID: [name] MRN: [id number] DOB/AGE: 03-03-1956 57 y.o.  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 He described the pain as "someone standing on my chest with spiked heels". In the ER he was writhing in pain and his initial EKG was read by the computer as "possible acute lateral MI". Follow up EKG after 2 mg of Dilaudid did not show any acute changes. His pain seemed to improve after sitting up. The pain was also worse with inspiration. He denied any radiation to his arms or jaw. He was admitted and ruled out for MI. A 2D echo demonstrated normal systolic function with an EF of 60-70% and no wall motion abnormalities. There was no evidence of pericardial effusion. A CXR showed low lung volumes with bibasilar subsegmental atelectasis, but no other findings. The patient was place on a trial of NSAIDS. He had mild improvement, but continued to endorse pleuritic chest pain. He was evaluated by Dr. Allyson Sabal on hospital day 2 and he noted a loud P2 on his cardiovascular exam. A d-dimer was then ordered to rule out PE. It returned elevated at 2.52. The patient was place on IV heparin. A CT of the chest with contrast was performed, but was negative for PE. Heparin was then discontinued. Dr. Allyson Sabal felt that if PE was ruled out, then  the patient could be discharged home on high dose NSAIDS. He ordered for him to follow-up for an outpatient nuclear stress test. The patient was last seen by Dr. Allyson Sabal and the patient's chart was reviewed by Dr. Royann Shivers. It was decided that the patient was stable for discharge. He will follow up in our office with a NST and will follow up with Dr. Royann Shivers on 08/30/13.    Consults: None  Significant Diagnostic Studies:   2D echo 08/15/13 Study Conclusions  - Left ventricle: The cavity size was normal. There was hypertrophy, with an appearance suggesting concentric remodeling (increased wall thickness with normal wall mass). Systolic function was vigorous. The estimated ejection fraction was in the range of 60% to 70%. Wall motion was normal; there were no regional wall motion abnormalities. Left ventricular diastolic function parameters were normal. - Ventricular septum: Septal motion showed "bounce". - Left atrium: The atrium was mildly to moderately dilated. - Atrial septum: No defect or patent foramen ovale was identified. Impressions:  - No significant heart disease.   D-dimer: 2.52 08/15/13  CT of Chest w/ Contrast 08/15/13 FINDINGS: There is no evidence of a pulmonary embolus.  The heart is unremarkable. The great vessels are normal in caliber. No aortic dissection. There are mildly prominent mediastinal lymph nodes. A left paracarina node measures 13 mm in short axis. There is a prevascular node that measures 9.5 mm in short axis. Sub cm hilar lymph nodes are also evident.  There is some  minor reticular opacity in the left upper lobe lingula associated with pulmonary anastomosis staples consistent with scarring. This is stable. The lungs are otherwise clear.  No pleural effusion. No pneumothorax.  Limited evaluation of the upper abdomen shows a small low-density liver lesion, stable in likely a cyst.  There are degenerative changes of the visualized spine.  No osteoblastic or osteolytic lesions.  Review of the MIP images confirms the above findings.  IMPRESSION: 1. No evidence of a pulmonary embolus. 2. No acute findings. 3. Mild scarring associated with pulmonary anastomosis staples in the left upper lobe lingula, stable. The lungs are otherwise clear. 4. Mildly prominent mediastinal lymph nodes, stable consistent with chronic, reactive nodes.   Treatments: See Hospital Course  Discharge Exam: Blood pressure 120/72, pulse 71, temperature 97.7 F (36.5 C), temperature source Oral, resp. rate 16, height 5\' 9"  (1.753 m), weight 207 lb 12.8 oz (94.257 kg), SpO2 100.00%.   Disposition: 01-Home or Self Care  Discharge Orders   Future Appointments Provider Department Dept Phone   08/30/2013 9:15 AM Thurmon Fair, MD [facility] [phone]   Future Orders Complete By Expires   Diet - low sodium heart healthy  As directed    Increase activity slowly  As directed        Medication List         amphetamine-dextroamphetamine 30 MG 24 hr capsule  Commonly known as:  ADDERALL XR  Take 30 mg by mouth 2 (two) times daily. Take 1 capsule in the morning and 1 capsule at noon     cyclobenzaprine 10 MG tablet  Commonly known as:  FLEXERIL  Take 10 mg by mouth 3 (three) times daily as needed. For muscle pain.     fish oil-omega-3 fatty acids 1000 MG capsule  Take 1 g by mouth every morning.     gabapentin 300 MG capsule  Commonly known as:  NEURONTIN  Take 1,200 mg by mouth 3 (three) times daily.     multivitamin with minerals Tabs tablet  Take 1 tablet by mouth 2 (two) times daily.     naproxen 500 MG tablet  Commonly known as:  NAPROSYN  Take 1 tablet (500 mg total) by mouth 2 (two) times daily with a meal.     omega-3 acid ethyl esters 1 G capsule  Commonly known as:  LOVAZA  Take 1 capsule (1 g total) by mouth daily.     oxycodone 30 MG immediate release tablet  Commonly known as:  ROXICODONE  Take 30 mg by  mouth every 6 (six) hours as needed. For pain.     potassium chloride SA 20 MEQ tablet  Commonly known as:  K-DUR,KLOR-CON  Take 20 mEq by mouth every morning.     SUMAtriptan 50 MG tablet  Commonly known as:  IMITREX  Take 50 mg by mouth every 2 (two) hours as needed. For migraines.     traZODone 50 MG tablet  Commonly known as:  DESYREL  Take 50-100 mg by mouth at bedtime as needed. For sleep           Follow-up Information   Follow up with Thurmon Fair, MD On 08/30/2013. (9:15 am)    Specialty:  Cardiology   Contact information:   [address] [place] [address] [phone]      TIME SPENT ON DISCHARGE, INCLUDING PHYSICIAN TIME: >30 MINUTES  Signed: Allayne Butcher, PA-C 08/16/2013, 11:46 AM

## 2013-08-16 NOTE — Care Management Note (Signed)
    Page 1 of 1   08/16/2013     2:29:54 PM   CARE MANAGEMENT NOTE 08/16/2013  Patient:  [name], [name]   Account Number:  [id number]  Date Initiated:  08/16/2013  Documentation initiated by:  [facility]  Subjective/Objective Assessment:   57 Y/O M ADMITTED W/CHEST PAIN.     Action/Plan:   FROM HOME.   Anticipated DC Date:  08/16/2013   Anticipated DC Plan:  HOME/SELF CARE      DC Planning Services  CM consult      Choice offered to / List presented to:             Status of service:  Completed, signed off Medicare Important Message given?   (If response is "NO", the following Medicare IM given date fields will be blank) Date Medicare IM given:   Date Additional Medicare IM given:    Discharge Disposition:  HOME/SELF CARE  Per UR Regulation:  Reviewed for med. necessity/level of care/duration of stay  If discussed at Long Length of Stay Meetings, dates discussed:    Comments:  08/16/13 Owain Eckerman RN,BSN NCM [phone] D/C HOME NO NEEDS OR ORDERS.

## 2013-08-19 ENCOUNTER — Telehealth (HOSPITAL_COMMUNITY): Payer: Self-pay | Admitting: *Deleted

## 2013-08-30 ENCOUNTER — Ambulatory Visit: Payer: Medicare Other | Admitting: Cardiovascular Disease

## 2013-09-06 ENCOUNTER — Encounter (HOSPITAL_COMMUNITY): Payer: Medicare Other

## 2013-11-18 ENCOUNTER — Ambulatory Visit (INDEPENDENT_AMBULATORY_CARE_PROVIDER_SITE_OTHER): Payer: Medicare Other | Admitting: Cardiovascular Disease

## 2013-11-18 ENCOUNTER — Encounter: Payer: Self-pay | Admitting: Cardiovascular Disease

## 2013-11-18 VITALS — BP 114/76 | HR 85 | Resp 16 | Ht 69.0 in | Wt 230.8 lb

## 2013-11-18 DIAGNOSIS — R079 Chest pain, unspecified: Secondary | ICD-10-CM

## 2013-11-18 NOTE — Progress Notes (Signed)
Patient ID: [name], male   DOB: [birth year], 58 y.o.   MRN: [id number]      Reason for office visit Follow up chest pain  [name] was briefly hospitalized last October for an episode of severe chest pain. It appeared to improve with nonsteroidal anti-inflammatory drugs and could have represented pericarditis. Workup for acute pulmonary embolism was negative. The pain appeared to be non-anginal and was not associated with ischemic EKG changes or any abnormalities of cardiac enzymes. He was discharged with a plan to undergo an outpatient myocardial perfusion study, but this has not been performed. Since that time he continues to have intermittent episodes of chest pain that is clearly not anginal in its pattern. It occurs randomly at rest or with light exercise and is not brought on by fairly intense aerobic activity. He denies shortness of breath. He is working on quitting smoking and is now down to half a pack a day. He has assumed guardianship of a 56 year old son of an ex-tenant, [name], and his. 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  Of note that there was no evidence of coronary calcification on the CT scan of his chest performed to exclude pulmonary embolism.  He used to be a Copywriter, advertisingprofessional skydiver and has multiple musculoskeletal injuries from falls. He has had a partial left lung removal via VATS.    Allergies  Allergen Reactions  . Meperidine Hcl Hives, Nausea And Vomiting and Other (See Comments)    shakes    Current Outpatient Prescriptions  Medication Sig Dispense Refill  . amphetamine-dextroamphetamine (ADDERALL XR) 30 MG 24 hr capsule Take 30 mg by mouth 2 (two) times daily. Take 1 capsule in the morning and 1 capsule at noon      . cyclobenzaprine (FLEXERIL) 10 MG tablet Take 10 mg by mouth 3 (three) times daily as needed. For muscle pain.      . fish oil-omega-3 fatty acids 1000 MG capsule Take 1 g by mouth every morning.       .  gabapentin (NEURONTIN) 300 MG capsule Take 1,200 mg by mouth 3 (three) times daily.       . Multiple Vitamin (MULITIVITAMIN WITH MINERALS) TABS Take 1 tablet by mouth 2 (two) times daily.      Marland Kitchen. omega-3 acid ethyl esters (LOVAZA) 1 G capsule Take 1 capsule (1 g total) by mouth daily.  30 capsule  5  . oxycodone (ROXICODONE) 30 MG immediate release tablet Take 30 mg by mouth every 6 (six) hours as needed. For pain.      . potassium chloride SA (K-DUR,KLOR-CON) 20 MEQ tablet Take 20 mEq by mouth every morning.       . SUMAtriptan (IMITREX) 50 MG tablet Take 50 mg by mouth every 2 (two) hours as needed. For migraines.      . traZODone (DESYREL) 50 MG tablet Take 50-100 mg by mouth at bedtime as needed. For sleep       No current facility-administered medications for this visit.    Past Medical History  Diagnosis Date  . Pneumonia   . Arthritis   . Depression   . Anxiety     Past Surgical History  Procedure Laterality Date  . Lung removal, partial  July 2010    VATs  . Knee surgery    . Back surgery    . Hernia repair      No family history on file.  History   Social History  .  Marital Status: Divorced    Spouse Name: N/A    Number of Children: N/A  . Years of Education: N/A   Occupational History  . Not on file.   Social History Main Topics  . Smoking status: Current Every Day Smoker -- 0.50 packs/day    Types: Cigarettes  . Smokeless tobacco: Not on file  . Alcohol Use: No  . Drug Use: No  . Sexual Activity: Yes   Other Topics Concern  . Not on file   Social History Narrative  . No narrative on file    Review of systems: The patient specifically denies dyspnea at rest or with exertion, orthopnea, paroxysmal nocturnal dyspnea, syncope, palpitations, focal neurological deficits, intermittent claudication, lower extremity edema, unexplained weight gain, cough, hemoptysis or wheezing.  The patient also denies abdominal pain, nausea, vomiting, dysphagia, diarrhea,  constipation, polyuria, polydipsia, dysuria, hematuria, frequency, urgency, abnormal bleeding or bruising, fever, chills, unexpected weight changes, mood swings, change in skin or hair texture, change in voice quality, auditory or visual problems, allergic reactions or rashes, new musculoskeletal complaints other than usual "aches and pains".   PHYSICAL EXAM BP 114/76  Pulse 85  Resp 16  Ht 5\' 9"  (1.753 m)  Wt 230 lb 12.8 oz (104.69 kg)  BMI 34.07 kg/m2  General: Alert, oriented x3, no distress Head: no evidence of trauma, PERRL, EOMI, no exophtalmos or lid lag, no myxedema, no xanthelasma; normal ears, nose and oropharynx Neck: normal jugular venous pulsations and no hepatojugular reflux; brisk carotid pulses without delay and no carotid bruits Chest: clear to auscultation, no signs of consolidation by percussion or palpation, normal fremitus, symmetrical and full respiratory excursions Cardiovascular: normal position and quality of the apical impulse, regular rhythm, normal first and second heart sounds, no murmurs, rubs or gallops. He has a very distinct midsystolic click Abdomen: no tenderness or distention, no masses by palpation, no abnormal pulsatility or arterial bruits, normal bowel sounds, no hepatosplenomegaly Extremities: no clubbing, cyanosis or edema; 2+ radial, ulnar and brachial pulses bilaterally; 2+ right femoral, posterior tibial and dorsalis pedis pulses; 2+ left femoral, posterior tibial and dorsalis pedis pulses; no subclavian or femoral bruits Neurological: grossly nonfocal   EKG: Sinus rhythm. Previous subtle ST segment elevation in V5 V6 has resolved.  BMET    Component Value Date/Time   NA 140 08/14/2013 1600   K 3.7 08/14/2013 1600   CL 98 08/14/2013 1600   CO2 31 08/14/2013 1600   GLUCOSE 86 08/14/2013 1600   BUN 22 08/14/2013 1600   CREATININE 1.27 08/14/2013 1600   CALCIUM 9.4 08/14/2013 1600   GFRNONAA 62* 08/14/2013 1600   GFRAA 72* 08/14/2013 1600      ASSESSMENT AND PLAN I suspect that the chest pain he describes is not anginal/coronary in etiology. Is almost certainly musculoskeletal he has had numerous injuries that could explain it. His electric cardiogram has completely normalized suggesting that he did indeed have acute pericarditis. Note that echocardiogram during intense pain showed no evidence of regional wall motion abnormalities. Roughly 3 months have elapsed since his hospitalization and at this point performing a stress test seems unnecessary. He has very limited coronary risk factors (smoking is the only one). He is planning on quitting smoking altogether on his upcoming birthday in February. He would like to see me one more time in about a year to make sure things are "okay", but at this point I would think it reasonable to just perform as needed visits anyway.  Orders Placed This  Encounter  Procedures  . EKG 12-Lead   No orders of the defined types were placed in this encounter.    Junious Silk, MD, [facility] [phone] office [phone] pager

## 2013-11-18 NOTE — Patient Instructions (Signed)
Your physician recommends that you schedule a follow-up appointment in: ONE YEAR 

## 2013-11-19 ENCOUNTER — Encounter: Payer: Self-pay | Admitting: Cardiovascular Disease

## 2014-06-18 ENCOUNTER — Emergency Department (HOSPITAL_COMMUNITY)
Admission: EM | Admit: 2014-06-18 | Discharge: 2014-06-18 | Disposition: A | Discharge status: Home / Self Care | Payer: Medicare Other | Attending: Emergency Medicine | Admitting: Emergency Medicine

## 2014-06-18 ENCOUNTER — Encounter (HOSPITAL_COMMUNITY): Payer: Self-pay | Admitting: Emergency Medicine

## 2014-06-18 DIAGNOSIS — W1809XA Striking against other object with subsequent fall, initial encounter: Secondary | ICD-10-CM | POA: Diagnosis not present

## 2014-06-18 DIAGNOSIS — F411 Generalized anxiety disorder: Secondary | ICD-10-CM | POA: Diagnosis not present

## 2014-06-18 DIAGNOSIS — W19XXXA Unspecified fall, initial encounter: Secondary | ICD-10-CM

## 2014-06-18 DIAGNOSIS — F329 Major depressive disorder, single episode, unspecified: Secondary | ICD-10-CM | POA: Diagnosis not present

## 2014-06-18 DIAGNOSIS — Z8701 Personal history of pneumonia (recurrent): Secondary | ICD-10-CM | POA: Diagnosis not present

## 2014-06-18 DIAGNOSIS — F17200 Nicotine dependence, unspecified, uncomplicated: Secondary | ICD-10-CM | POA: Insufficient documentation

## 2014-06-18 DIAGNOSIS — Y929 Unspecified place or not applicable: Secondary | ICD-10-CM | POA: Diagnosis not present

## 2014-06-18 DIAGNOSIS — S0990XA Unspecified injury of head, initial encounter: Secondary | ICD-10-CM | POA: Insufficient documentation

## 2014-06-18 DIAGNOSIS — Z9181 History of falling: Secondary | ICD-10-CM | POA: Insufficient documentation

## 2014-06-18 DIAGNOSIS — Y9301 Activity, walking, marching and hiking: Secondary | ICD-10-CM | POA: Diagnosis not present

## 2014-06-18 DIAGNOSIS — R42 Dizziness and giddiness: Secondary | ICD-10-CM | POA: Diagnosis not present

## 2014-06-18 DIAGNOSIS — Z79899 Other long term (current) drug therapy: Secondary | ICD-10-CM | POA: Insufficient documentation

## 2014-06-18 DIAGNOSIS — Z9889 Other specified postprocedural states: Secondary | ICD-10-CM | POA: Diagnosis not present

## 2014-06-18 DIAGNOSIS — F3289 Other specified depressive episodes: Secondary | ICD-10-CM | POA: Diagnosis not present

## 2014-06-18 DIAGNOSIS — IMO0002 Reserved for concepts with insufficient information to code with codable children: Secondary | ICD-10-CM | POA: Insufficient documentation

## 2014-06-18 DIAGNOSIS — M1710 Unilateral primary osteoarthritis, unspecified knee: Secondary | ICD-10-CM | POA: Insufficient documentation

## 2014-06-18 LAB — CBC WITH DIFFERENTIAL/PLATELET
BASOS PCT: 1 % (ref 0–1)
Basophils Absolute: 0.1 10*3/uL (ref 0.0–0.1)
Eosinophils Absolute: 0.3 10*3/uL (ref 0.0–0.7)
Eosinophils Relative: 3 % (ref 0–5)
HCT: 39.7 % (ref 39.0–52.0)
HEMOGLOBIN: 13.1 g/dL (ref 13.0–17.0)
Lymphocytes Relative: 25 % (ref 12–46)
Lymphs Abs: 2.2 10*3/uL (ref 0.7–4.0)
MCH: 28.8 pg (ref 26.0–34.0)
MCHC: 33 g/dL (ref 30.0–36.0)
MCV: 87.3 fL (ref 78.0–100.0)
MONO ABS: 0.7 10*3/uL (ref 0.1–1.0)
Monocytes Relative: 8 % (ref 3–12)
NEUTROS PCT: 63 % (ref 43–77)
Neutro Abs: 5.7 10*3/uL (ref 1.7–7.7)
Platelets: 205 10*3/uL (ref 150–400)
RBC: 4.55 MIL/uL (ref 4.22–5.81)
RDW: 14.2 % (ref 11.5–15.5)
WBC: 8.9 10*3/uL (ref 4.0–10.5)

## 2014-06-18 LAB — COMPREHENSIVE METABOLIC PANEL
ALBUMIN: 3.9 g/dL (ref 3.5–5.2)
ALT: 28 U/L (ref 0–53)
ANION GAP: 10 (ref 5–15)
AST: 22 U/L (ref 0–37)
Alkaline Phosphatase: 86 U/L (ref 39–117)
BUN: 16 mg/dL (ref 6–23)
CALCIUM: 9 mg/dL (ref 8.4–10.5)
CO2: 26 mEq/L (ref 19–32)
CREATININE: 1.19 mg/dL (ref 0.50–1.35)
Chloride: 102 mEq/L (ref 96–112)
GFR calc Af Amer: 77 mL/min — ABNORMAL LOW (ref >90)
GFR calc non Af Amer: 67 mL/min — ABNORMAL LOW (ref >90)
Glucose, Bld: 120 mg/dL — ABNORMAL HIGH (ref 70–99)
POTASSIUM: 4.8 meq/L (ref 3.7–5.3)
Sodium: 138 mEq/L (ref 137–147)
Total Bilirubin: <0.2 mg/dL — ABNORMAL LOW (ref 0.3–1.2)
Total Protein: 7 g/dL (ref 6.0–8.3)

## 2014-06-18 MED ORDER — SODIUM CHLORIDE 0.9 % IV BOLUS (SEPSIS)
1000.0000 mL | Freq: Once | INTRAVENOUS | Status: AC
  Administered 2014-06-18: 1000 mL via INTRAVENOUS

## 2014-06-18 MED ORDER — SODIUM CHLORIDE 0.9 % IV SOLN
INTRAVENOUS | Status: DC
  Administered 2014-06-18: 125 mL/h via INTRAVENOUS

## 2014-06-18 MED ORDER — ALBUTEROL SULFATE (2.5 MG/3ML) 0.083% IN NEBU
2.5000 mg | INHALATION_SOLUTION | Freq: Once | RESPIRATORY_TRACT | Status: AC
  Administered 2014-06-18: 2.5 mg via RESPIRATORY_TRACT
  Filled 2014-06-18: qty 3

## 2014-06-18 NOTE — ED Notes (Signed)
Made respiratory aware of neb treatment ordered 

## 2014-06-18 NOTE — ED Provider Notes (Signed)
CSN: [id number]     Arrival date & time 06/18/14  1206 History   First MD Initiated Contact with Patient 06/18/14 1224     Chief Complaint  Patient presents with  . Fall     (Consider location/radiation/quality/duration/timing/severity/associated sxs/prior Treatment) HPI Comments: Patient here complaining of increased falls caused secondary to knee weakness. Patient has known severe knee DJD and is scheduled for surgery and does use a cane to walk. States when he was walking last night he fell twice. Denies any dyspnea, headache, chest pain prior to the event. Has had intermittent dizziness which is half a year secondary to her prior head injury which he sustained when he was in the [employer]. No black or bloody stools. When he fell he struck his head but did not lose consciousness. He does not currently take any blood thinners. Complains of some pain to his occiput but denies any new back pain. Has had decreased by mouth intake recently is concerned he might be dehydrated.  Patient is a 58 y.o. male presenting with fall. The history is provided by the patient and a relative.  Fall    Past Medical History  Diagnosis Date  . Pneumonia   . Arthritis   . Depression   . Anxiety    Past Surgical History  Procedure Laterality Date  . Lung removal, partial  July 2010    VATs  . Knee surgery    . Back surgery    . Hernia repair     No family history on file. History  Substance Use Topics  . Smoking status: Current Every Day Smoker -- 0.50 packs/day    Types: Cigarettes  . Smokeless tobacco: Not on file  . Alcohol Use: No    Review of Systems  All other systems reviewed and are negative.     Allergies  Meperidine hcl  Home Medications   Prior to Admission medications   Medication Sig Start Date End Date Taking? Authorizing Provider  amphetamine-dextroamphetamine (ADDERALL XR) 30 MG 24 hr capsule Take 30 mg by mouth 2 (two) times daily. Take 1 capsule in the morning and 1  capsule at noon   Yes Historical Provider, MD  cyclobenzaprine (FLEXERIL) 10 MG tablet Take 10 mg by mouth 3 (three) times daily as needed. For muscle pain.    Historical Provider, MD  fish oil-omega-3 fatty acids 1000 MG capsule Take 1 g by mouth every morning.     Historical Provider, MD  gabapentin (NEURONTIN) 300 MG capsule Take 1,200 mg by mouth 3 (three) times daily.     Historical Provider, MD  Multiple Vitamin (MULITIVITAMIN WITH MINERALS) TABS Take 1 tablet by mouth 2 (two) times daily.    Historical Provider, MD  omega-3 acid ethyl esters (LOVAZA) 1 G capsule Take 1 capsule (1 g total) by mouth daily. 08/16/13   Brittainy Simmons, PA-C  oxycodone (ROXICODONE) 30 MG immediate release tablet Take 30 mg by mouth every 6 (six) hours as needed. For pain.    Historical Provider, MD  potassium chloride SA (K-DUR,KLOR-CON) 20 MEQ tablet Take 20 mEq by mouth every morning.     Historical Provider, MD  SUMAtriptan (IMITREX) 50 MG tablet Take 50 mg by mouth every 2 (two) hours as needed. For migraines.    Historical Provider, MD  traZODone (DESYREL) 50 MG tablet Take 50-100 mg by mouth at bedtime as needed. For sleep    Historical Provider, MD   BP 125/84  Pulse 96  Temp(Src) 97.8 F (36.6  C) (Oral)  Resp 20  SpO2 96% Physical Exam  Nursing note and vitals reviewed. Constitutional: He is oriented to person, place, and time. He appears well-developed and well-nourished.  Non-toxic appearance. No distress.  HENT:  Head: Normocephalic and atraumatic.  Eyes: Conjunctivae, EOM and lids are normal. Pupils are equal, round, and reactive to light.  Neck: Normal range of motion. Neck supple. No tracheal deviation present. No mass present.  Cardiovascular: Normal rate, regular rhythm and normal heart sounds.  Exam reveals no gallop.   No murmur heard. Pulmonary/Chest: Effort normal and breath sounds normal. No stridor. No respiratory distress. He has no decreased breath sounds. He has no wheezes. He  has no rhonchi. He has no rales.  Abdominal: Soft. Normal appearance and bowel sounds are normal. He exhibits no distension. There is no tenderness. There is no rebound and no CVA tenderness.  Musculoskeletal: Normal range of motion. He exhibits no edema and no tenderness.  Neurological: He is alert and oriented to person, place, and time. He has normal strength. No cranial nerve deficit or sensory deficit. GCS eye subscore is 4. GCS verbal subscore is 5. GCS motor subscore is 6.  Skin: Skin is warm and dry. No abrasion and no rash noted.  Psychiatric: His speech is normal and behavior is normal. His affect is blunt.    ED Course  Procedures (including critical care time) Labs Review Labs Reviewed  COMPREHENSIVE METABOLIC PANEL - Abnormal; Notable for the following:    Glucose, Bld 120 (*)    Total Bilirubin <0.2 (*)    GFR calc non Af Amer 67 (*)    GFR calc Af Amer 77 (*)    All other components within normal limits  CBC WITH DIFFERENTIAL    Imaging Review No results found.   EKG Interpretation None      MDM   Final diagnoses:  None    Patient shows as baseline at this time. Labs are reassuring. Stable for discharge    Toy Baker, MD 06/18/14 1514

## 2014-06-18 NOTE — Discharge Instructions (Signed)
Followup with your Dr. as needed °

## 2014-06-18 NOTE — ED Notes (Addendum)
Pt fell twice today due to knee given out. Pt hit head twice once in the back and once in the front. Pt family member states he has been having dizzy spells as well asn has not been eating or drinking adequately
